# Patient Record
Sex: Female | Born: 1990 | Race: White | Hispanic: No | Marital: Married | Smoking: Never smoker
Health system: Southern US, Community
[De-identification: ages and names within clinical notes are randomized; demographics above are authoritative.]

---
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
## 2008-02-19 ENCOUNTER — Inpatient Hospital Stay (HOSPITAL_COMMUNITY): Admission: AD | Admit: 2008-02-19 | Discharge: 2008-02-19 | Payer: Self-pay | Admitting: Obstetrics & Gynecology

## 2008-03-12 ENCOUNTER — Inpatient Hospital Stay (HOSPITAL_COMMUNITY): Admission: AD | Admit: 2008-03-12 | Discharge: 2008-03-12 | Payer: Self-pay | Admitting: Obstetrics and Gynecology

## 2008-03-20 ENCOUNTER — Inpatient Hospital Stay (HOSPITAL_COMMUNITY): Admission: AD | Admit: 2008-03-20 | Discharge: 2008-03-22 | Payer: Self-pay | Admitting: Obstetrics and Gynecology

## 2009-03-05 ENCOUNTER — Emergency Department (HOSPITAL_COMMUNITY): Admission: EM | Admit: 2009-03-05 | Discharge: 2009-03-05 | Payer: Self-pay | Admitting: Emergency Medicine

## 2009-10-24 ENCOUNTER — Emergency Department (HOSPITAL_COMMUNITY): Admission: EM | Admit: 2009-10-24 | Discharge: 2009-10-24 | Payer: Self-pay | Admitting: Emergency Medicine

## 2010-06-10 LAB — CBC
HCT: 28.6 % — ABNORMAL LOW (ref 36.0–46.0)
HCT: 33.9 % — ABNORMAL LOW (ref 36.0–46.0)
Hemoglobin: 11.4 g/dL — ABNORMAL LOW (ref 12.0–15.0)
Hemoglobin: 9.8 g/dL — ABNORMAL LOW (ref 12.0–15.0)
MCHC: 34.4 g/dL (ref 30.0–36.0)
MCV: 87.7 fL (ref 78.0–100.0)
RDW: 13.2 % (ref 11.5–15.5)
WBC: 15.5 10*3/uL — ABNORMAL HIGH (ref 4.0–10.5)

## 2010-06-10 LAB — RPR: RPR Ser Ql: NONREACTIVE

## 2010-07-09 NOTE — Discharge Summary (Signed)
NAMELUDMILA, EBARB                ACCOUNT NO.:  [id number]   MEDICAL RECORD NO.:  [id number]          PATIENT TYPE:  INP   LOCATION:  9106                          FACILITY:  [facility]   PHYSICIAN:  Gerrit Friends. Aldona Bar, M.D.   DATE OF BIRTH:  February 06, 1991   DATE OF ADMISSION:  03/20/2008  DATE OF DISCHARGE:  03/22/2008                               DISCHARGE SUMMARY   DISCHARGE DIAGNOSES:  1. Term pregnancy, delivered 8 pounds 9 ounces female infant, Apgars 8      and 9.  2. Blood type A positive.   PROCEDURES:  1. Normal spontaneous delivery.  2. Second-degree tear and repair.   SUMMARY:  This 20 year old primigravida was admitted at term after  ruptured membranes and an uncomplicated prenatal course.  She  progressed, requiring Pitocin augmentation and received an epidural on  request and subsequently had a normal spontaneous delivery of a viable  female infant with good Apgars over a second-degree tear, which was  repaired without any difficulty.  Her postpartum course was  uncomplicated.  Her discharge hemoglobin was 9.8 with a white count  15,500 and a platelet count of 140,000.  The morning of March 22, 2008, she was afebrile, vital signs were stable.  She was ambulating  well, breast-feeding without difficulty, having normal bowel and bladder  function, was desirous of discharge.  Accordingly, she was given all  discharge instructions per discharge brochure and understood all  instructions well.   Discharge medications include vitamins one a day as long she is breast-  feeding, Feosol capsules - one daily until her postpartum visit in 4  weeks, Tylox 1-2 every 4-6 hours as needed for severe pain, and Motrin  600 mg every 6 hours for cramping or less severe pain.  As mentioned,  she will return to the office for followup in approximately 4 weeks'  time or as needed.   CONDITION ON DISCHARGE:  Improved.      Gerrit Friends. Aldona Bar, M.D.  Electronically Signed     RMW/MEDQ  D:   03/22/2008  T:  03/22/2008  Job:  [id number]

## 2010-08-08 ENCOUNTER — Ambulatory Visit (HOSPITAL_COMMUNITY)
Admission: RE | Admit: 2010-08-08 | Discharge: 2010-08-08 | Disposition: A | Discharge status: Home / Self Care | Payer: Medicaid Other | Source: Ambulatory Visit | Attending: Family Medicine | Admitting: Family Medicine

## 2010-08-08 ENCOUNTER — Other Ambulatory Visit (HOSPITAL_COMMUNITY): Payer: Self-pay | Admitting: Family Medicine

## 2010-08-08 ENCOUNTER — Inpatient Hospital Stay (HOSPITAL_COMMUNITY)
Admission: AD | Admit: 2010-08-08 | Discharge: 2010-08-08 | Disposition: A | Discharge status: Home / Self Care | Payer: Medicaid Other | Source: Ambulatory Visit | Attending: Obstetrics & Gynecology | Admitting: Obstetrics & Gynecology

## 2010-08-08 DIAGNOSIS — O209 Hemorrhage in early pregnancy, unspecified: Secondary | ICD-10-CM | POA: Insufficient documentation

## 2010-08-08 DIAGNOSIS — R1032 Left lower quadrant pain: Secondary | ICD-10-CM | POA: Insufficient documentation

## 2010-08-08 DIAGNOSIS — O00109 Unspecified tubal pregnancy without intrauterine pregnancy: Secondary | ICD-10-CM | POA: Insufficient documentation

## 2010-08-08 LAB — WET PREP, GENITAL
Trich, Wet Prep: NONE SEEN
WBC, Wet Prep HPF POC: NONE SEEN
Yeast Wet Prep HPF POC: NONE SEEN

## 2010-08-08 LAB — COMPREHENSIVE METABOLIC PANEL
Albumin: 4.3 g/dL (ref 3.5–5.2)
Chloride: 101 mEq/L (ref 96–112)
Creatinine, Ser: 0.67 mg/dL (ref 0.4–1.2)
GFR calc Af Amer: >60 mL/min (ref >60)
Glucose, Bld: 86 mg/dL (ref 70–99)
Potassium: 3.5 mEq/L (ref 3.5–5.1)
Sodium: 135 mEq/L (ref 135–145)

## 2010-08-08 LAB — DIFFERENTIAL
Basophils Relative: 0 % (ref 0–1)
Eosinophils Relative: 1 % (ref 0–5)
Lymphs Abs: 1.9 10*3/uL (ref 0.7–4.0)
Monocytes Relative: 7 % (ref 3–12)
Neutrophils Relative %: 64 % (ref 43–77)

## 2010-08-08 LAB — HCG, QUANTITATIVE, PREGNANCY: hCG, Beta Chain, Quant, S: 2602 m[IU]/mL — ABNORMAL HIGH (ref <5)

## 2010-08-08 LAB — CBC
MCV: 83.1 fL (ref 78.0–100.0)
RBC: 4.92 MIL/uL (ref 3.87–5.11)

## 2010-08-09 ENCOUNTER — Other Ambulatory Visit: Payer: Self-pay | Admitting: Obstetrics & Gynecology

## 2010-08-09 ENCOUNTER — Ambulatory Visit (HOSPITAL_COMMUNITY): Payer: Self-pay

## 2010-08-09 ENCOUNTER — Inpatient Hospital Stay (HOSPITAL_COMMUNITY): Payer: Medicaid Other

## 2010-08-09 ENCOUNTER — Ambulatory Visit (HOSPITAL_COMMUNITY)
Admission: AD | Admit: 2010-08-09 | Discharge: 2010-08-09 | Disposition: A | Discharge status: Home / Self Care | Payer: Medicaid Other | Source: Ambulatory Visit | Attending: Obstetrics & Gynecology | Admitting: Obstetrics & Gynecology

## 2010-08-09 DIAGNOSIS — O00109 Unspecified tubal pregnancy without intrauterine pregnancy: Secondary | ICD-10-CM

## 2010-08-09 HISTORY — PX: LAPAROSCOPY FOR ECTOPIC PREGNANCY: SUR765

## 2010-08-09 LAB — CBC
Hemoglobin: 13.5 g/dL (ref 12.0–15.0)
MCH: 28 pg (ref 26.0–34.0)
MCHC: 33.5 g/dL (ref 30.0–36.0)
MCV: 83.6 fL (ref 78.0–100.0)
Platelets: 207 10*3/uL (ref 150–400)
RBC: 4.82 MIL/uL (ref 3.87–5.11)
WBC: 6.4 10*3/uL (ref 4.0–10.5)

## 2010-08-09 LAB — GC/CHLAMYDIA PROBE AMP, GENITAL: Chlamydia, DNA Probe: NEGATIVE

## 2010-08-13 LAB — CROSSMATCH
ABO/RH(D): A POS
Antibody Screen: NEGATIVE
Unit division: 0

## 2010-08-25 NOTE — Op Note (Signed)
[name], [name]               ACCOUNT NO.:  [id number]  MEDICAL RECORD NO.:  [id number]  LOCATION:  [facility]                          FACILITY:  [facility]  PHYSICIAN:  Horton Chin, MD DATE OF BIRTH:  [birth year]  DATE OF PROCEDURE:  08/09/2010 DATE OF DISCHARGE:                              OPERATIVE REPORT   PREOPERATIVE DIAGNOSIS:  Ruptured ectopic pregnancy.  POSTOPERATIVE DIAGNOSIS:  Ruptured ectopic pregnancy.  PROCEDURE:  Laparoscopic left salpingectomy and removal of ectopic pregnancy.  SURGEON:  Horton Chin, MD  ANESTHESIA:  General.  ESTIMATED BLOOD LOSS:  100 mL.  INDICATIONS:  The patient is a 20 year old gravida 3, para 1-0-2-1, who was diagnosed with a left ectopic pregnancy and underwent administration of methotrexate on August 08, 2010.  The patient returned to the MAU today via EMS with debilitating pain, and bedside ultrasound showed a moderate amount of hemoperitoneum.  The patient also had an acute abdomen.  Given the concern about a ruptured ectopic pregnancy, the patient was counseled regarding needing urgent surgery.  The risks of surgery were explained to the patient including, but not limited to bleeding, infection, injury to surrounding organs, need for additional procedures, and written informed consent was obtained.  FINDINGS:  The patient has significant hemoperitoneum, left fallopian tubal ectopic pregnancy encompassing almost entire length of the tube. Normal uterus and both ovaries, normal right fallopian tubes.  SPECIMENS:  Left fallopian tube containing ectopic pregnancy which was sent to pathology.  COMPLICATIONS:  None immediate.  PROCEDURE IN DETAIL:  The patient received preoperative antibiotics and was taken to the operating room.  In the operating room, general analgesia was administered and found to be adequate.  She was then placed in the dorsal lithotomy position and prepped and draped in a sterile manner.  A Foley  catheter was inserted into the patient's bladder and attached to constant gravity.  A uterine manipulator was also inserted at this point.  After an adequate time-out was performed, attention was turned to her abdomen where an umbilical incision was made and a Veress needle was passed through the umbilical incision into the peritoneal cavity, correct intraperitoneal cavity placement was confirmed.  Upon insufflation with carbon dioxide gas with a low opening pressure of 2 mmHg, the abdomen was insufflated to 50 mmHg.  The Veress needle was removed and a 10-mm trocar was then placed through the umbilical incision.  The laparoscope was then placed through this, and a survey of the abdomen and pelvis were done which were notable for the findings as above.  At this point, bilateral lower quadrant ports were placed using 5-mm trocars.  Through these trocars, the left fallopian tube was grasped and the gyrus instrument was used to free the fallopian tube from its attachment to the uterus and mesosalpinx area.  The fallopian tube was able to removed in its entirety.  There was excellent hemostasis.  All the hemoperitoneum was suctioned using the Nezhat apparatus prior to doing the salpingectomy.  The abdomen was then irrigated again and hemostasis was confirmed.  At this point, an EndoCatch bag was placed and the tube was able to be removed in its entirety from the abdomen.  The  fascial incision of the umbilical port was repaired using 0 Vicryl interrupted sutures and the skin incision followed the umbilical port and the two 5-mm ports were closed using Dermabond.  The patient tolerated the procedure well.  Sponge, instrument, and needle counts were correct x2.  She was taken to recovery room awake, extubated and in stable condition.  DISPOSITION:  The patient was given routine laparoscopic discharge instructions and was also given prescription for Percocet, ibuprofen and Colace.  She was told  about her postoperative appointment that was made for her in the clinic on September 04, 2010, at 12:45 p.m.  She was told to call the clinic or come to the MAU for any further postoperative concerns.     Horton Chin, MD     UAA/MEDQ  D:  08/09/2010  T:  08/10/2010  Job:  [id number]  Electronically Signed by Jaynie Collins MD on 08/25/2010 10:00:24 AM

## 2010-08-25 DEATH — deceased

## 2010-09-04 ENCOUNTER — Encounter: Payer: Self-pay | Admitting: Family Medicine

## 2010-09-04 ENCOUNTER — Ambulatory Visit (INDEPENDENT_AMBULATORY_CARE_PROVIDER_SITE_OTHER): Payer: Medicaid Other | Admitting: Family Medicine

## 2010-09-04 VITALS — BP 134/79 | HR 64 | Temp 98.0°F | Ht 65.0 in | Wt 159.7 lb

## 2010-09-04 DIAGNOSIS — O00109 Unspecified tubal pregnancy without intrauterine pregnancy: Secondary | ICD-10-CM

## 2010-09-04 NOTE — Progress Notes (Signed)
  Subjective:     [name] is a 20 y.o. female who presents to the clinic 4 weeks status post left salpingectomy from ectopic pregnancy. Eating a regular diet without difficulty. Bowel movements are normal. The patient is not having any pain. Pt does however endorse some left sided cramping and pain after intercourse.  Review of Systems Pertinent items are noted in HPI.    Objective:    BP 134/79  Pulse 64  Temp(Src) 98 F (36.7 C) (Oral)  Ht 5\' 5"  (1.651 m)  Wt 159 lb 11.2 oz (72.439 kg)  BMI 26.58 kg/m2  LMP 06/27/2010 General:  alert, cooperative, appears stated age and no distress  Abdomen: soft, bowel sounds active, non-tender  Incision:   healing well, no drainage, no erythema, no seroma, no swelling     Assessment:    Doing well postoperatively. Operative findings again reviewed. Pathology report discussed.    Plan:    1. Continue any current medications. 2. Wound care discussed. 3. Activity restrictions: none 4. Anticipated return to work: not applicable. 5. Follow up: PRN

## 2010-11-29 LAB — URINALYSIS, ROUTINE W REFLEX MICROSCOPIC
Glucose, UA: NEGATIVE mg/dL
Ketones, ur: NEGATIVE mg/dL
Nitrite: NEGATIVE
pH: 7.5 (ref 5.0–8.0)

## 2013-05-07 IMAGING — US US OB TRANSVAGINAL
1 series · 14 of 28 positions shown · non-contrast
Comparison: none

[Series 1: us ob transvaginal · 14 of 30 slices shown]
[im 2/30]
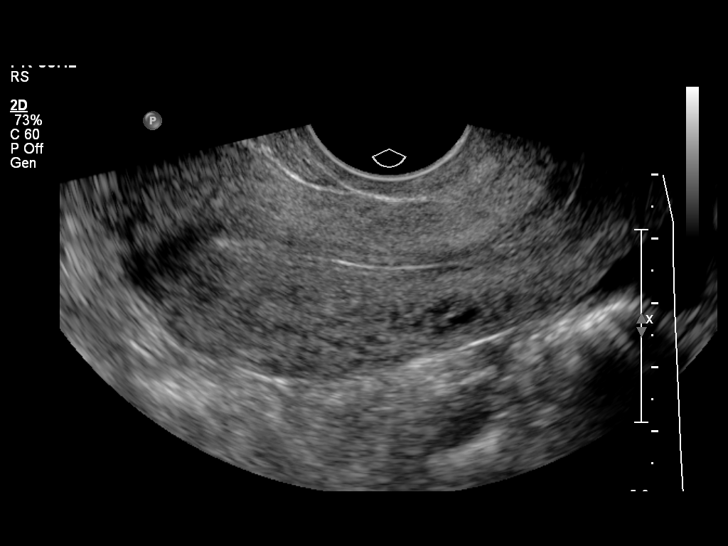
[im 4/30]
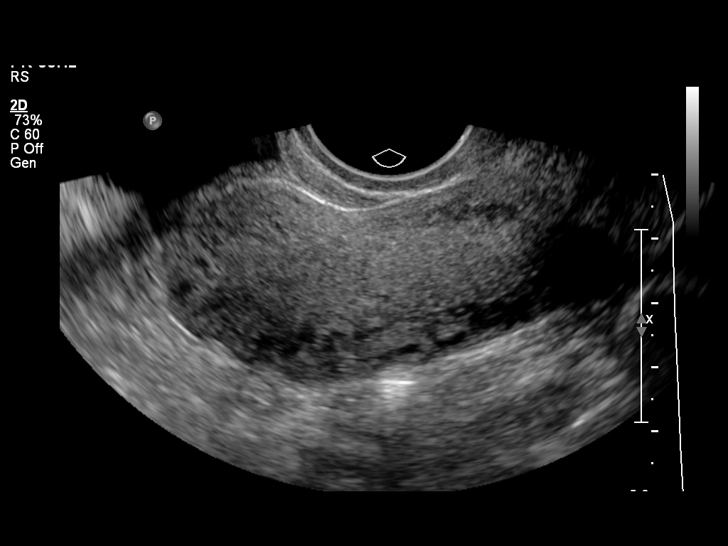
[im 6/30]
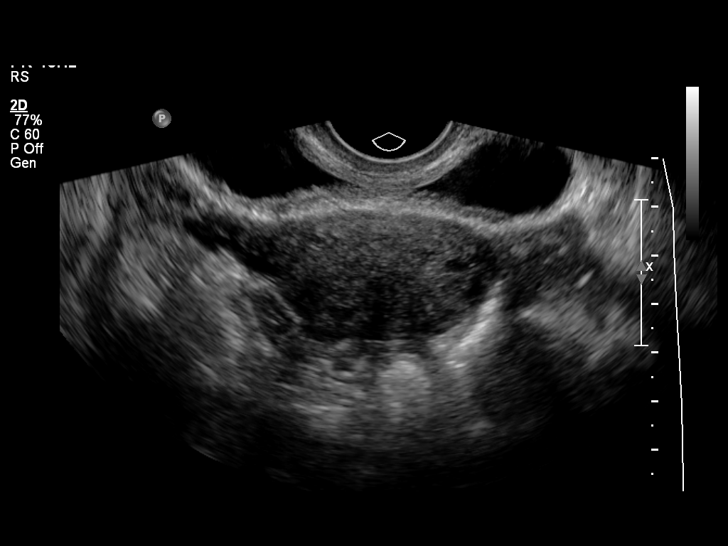
[im 8/30]
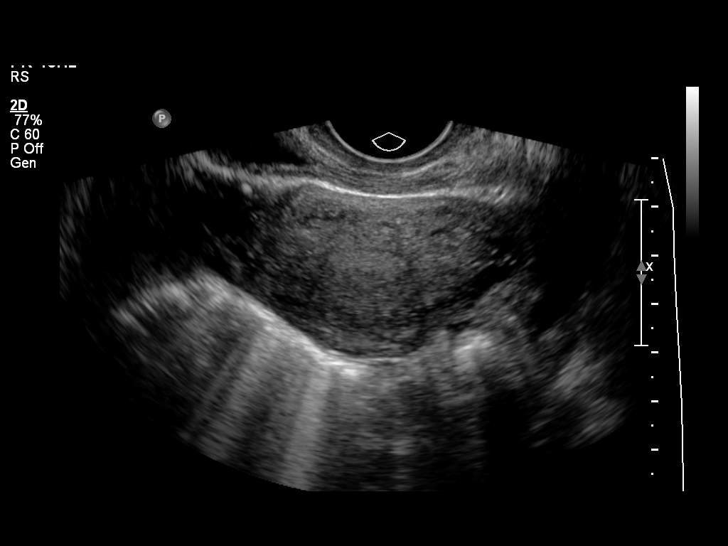
[im 10/30]
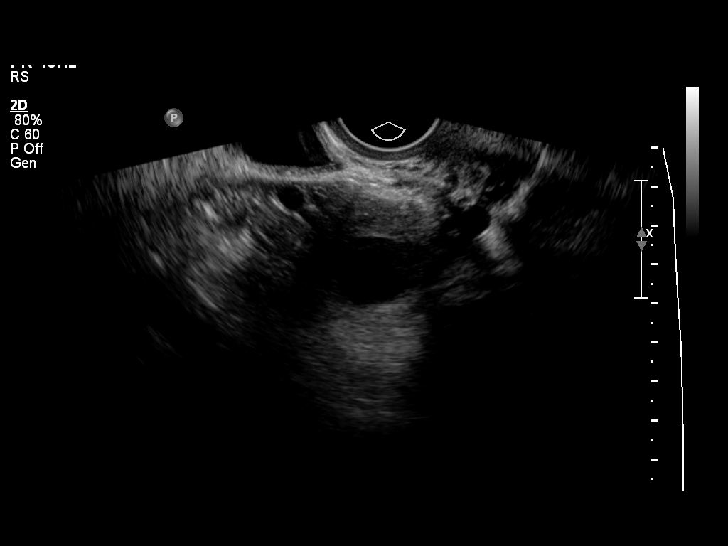
[im 12/30]
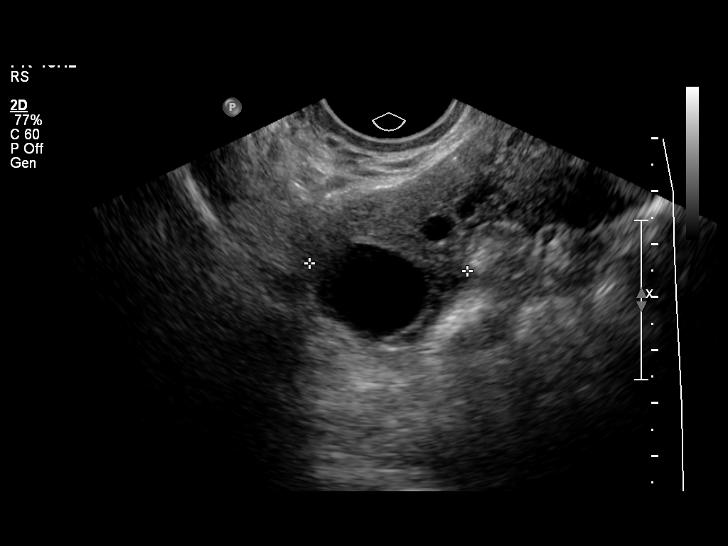
[im 14/30]
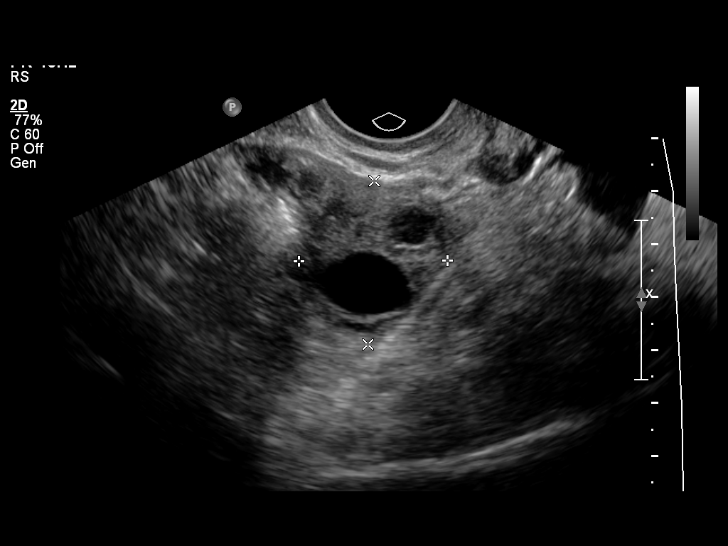
[im 17/30]
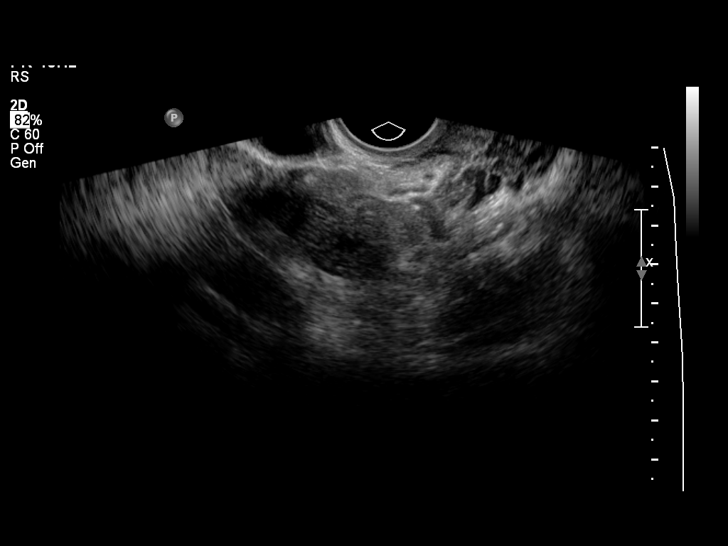
[im 19/30]
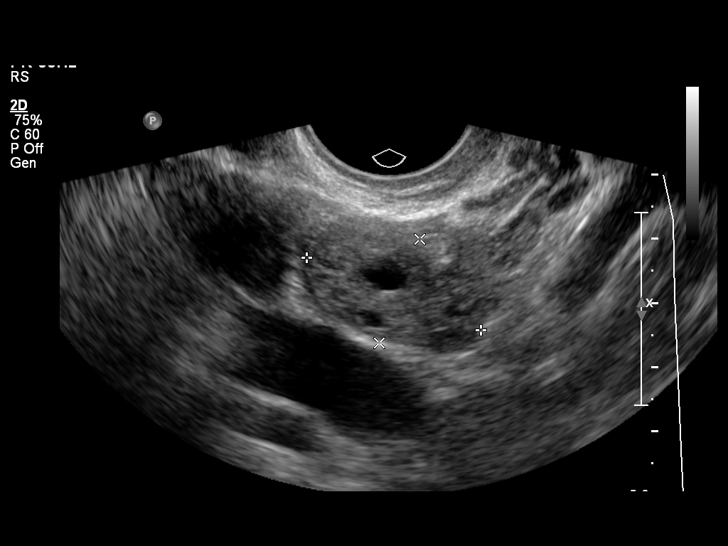
[im 21/30]
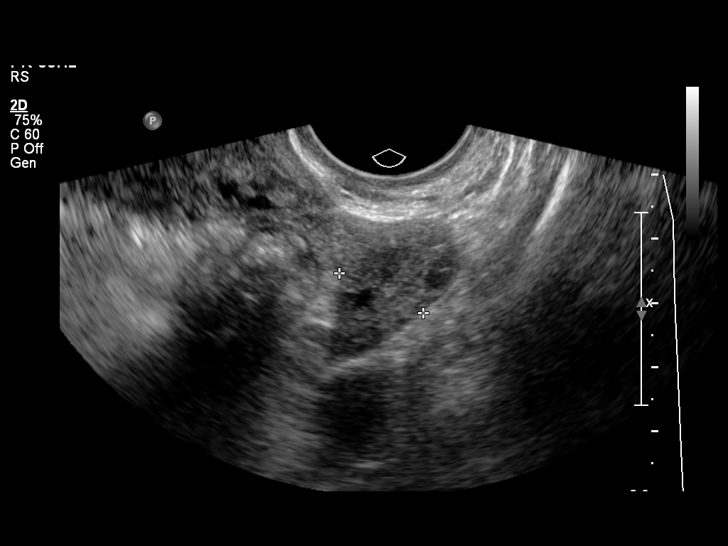
[im 23/30]
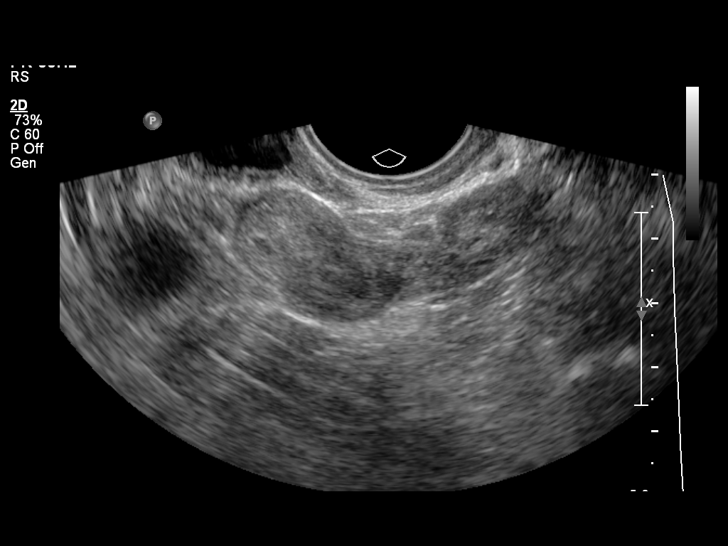
[im 25/30]
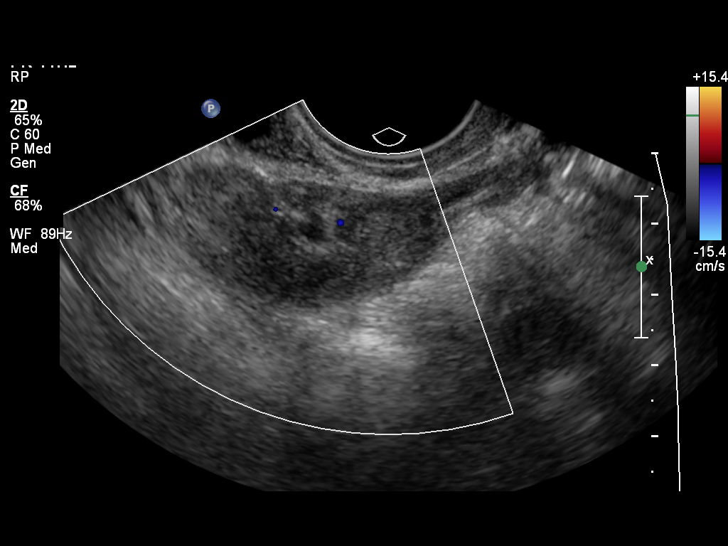
[im 27/30]
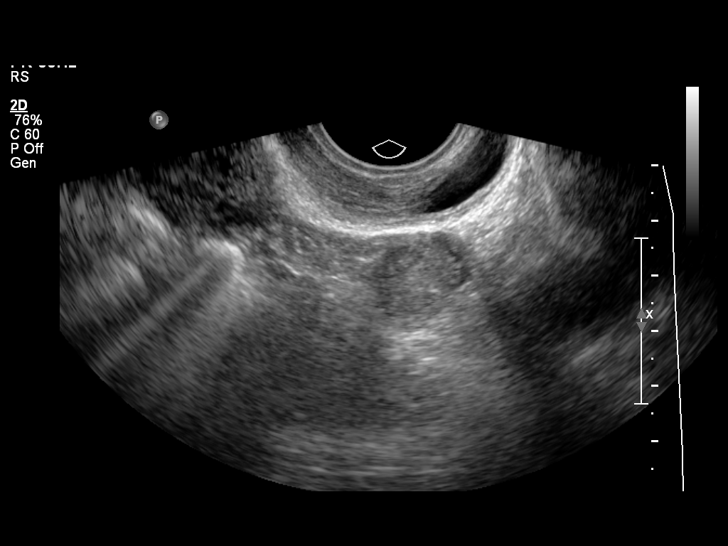
[im 30/30]
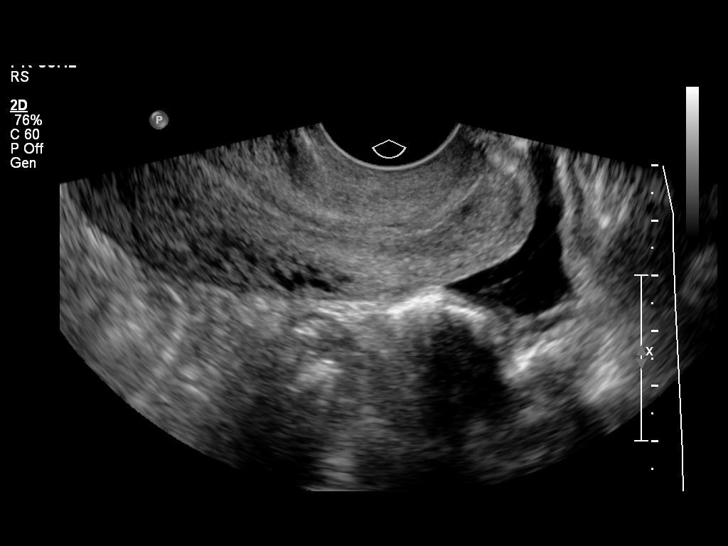

[14 of 28 positions shown; findings below may reference images not displayed]

OBSTETRICS REPORT
                      (Signed Final 08/09/2010 [DATE])

Procedures

 US OB TRANSVAGINAL                                    76817.0
Indications

 Pain - LLQ
 Ectopic pregnancy
Fetal Evaluation

 Preg. Location:    Ectopic in left adnexa
 Gest. Sac:         None seen
 Yolk Sac:          Not visualized
 Fetal Pole:        Not visualized
 Cardiac Activity:  No embryo visualized
Cervix Uterus Adnexa

 Cervix:       Normal appearance by transvaginal scan
 Uterus:       No abnormality visualized.
 Cul De Sac:   Small amount of simple free fluid seen.
 Left Ovary:    Within normal limits measuring 2.9 x 1.7 x 1.5 cm.
 Right Ovary:   Within normal limits measuring 3.0 x 2.8 x 3.1 cm.
 Adnexa:     Soft tissue mass superior to left ovary measuring 2.6 x
             1.6 x 1.6 cm.
Impression

 Thin endometrial stripe with no IUP seen. Normal ovaries.
 Soft tissue mass superior to the right ovary is slightly larger
 than on prior exam and again strongly suspicious for an
 ectopic gestation. No increase in pelvic fluid or developement
 of complex fluid is seen.

## 2021-02-13 ENCOUNTER — Other Ambulatory Visit: Payer: Self-pay

## 2021-02-13 ENCOUNTER — Ambulatory Visit (INDEPENDENT_AMBULATORY_CARE_PROVIDER_SITE_OTHER): Payer: 59 | Admitting: Family Medicine

## 2021-02-13 ENCOUNTER — Encounter: Payer: Self-pay | Admitting: Family Medicine

## 2021-02-13 VITALS — BP 107/63 | HR 56 | Temp 98.4°F | Ht 65.5 in | Wt 181.6 lb

## 2021-02-13 DIAGNOSIS — E663 Overweight: Secondary | ICD-10-CM | POA: Insufficient documentation

## 2021-02-13 DIAGNOSIS — N926 Irregular menstruation, unspecified: Secondary | ICD-10-CM

## 2021-02-13 DIAGNOSIS — Z1159 Encounter for screening for other viral diseases: Secondary | ICD-10-CM

## 2021-02-13 DIAGNOSIS — R001 Bradycardia, unspecified: Secondary | ICD-10-CM | POA: Diagnosis not present

## 2021-02-13 DIAGNOSIS — R202 Paresthesia of skin: Secondary | ICD-10-CM | POA: Diagnosis not present

## 2021-02-13 DIAGNOSIS — Z13220 Encounter for screening for lipoid disorders: Secondary | ICD-10-CM

## 2021-02-13 DIAGNOSIS — Z114 Encounter for screening for human immunodeficiency virus [HIV]: Secondary | ICD-10-CM

## 2021-02-13 LAB — URINALYSIS, ROUTINE W REFLEX MICROSCOPIC
Bilirubin, UA: NEGATIVE
Glucose, UA: NEGATIVE
Ketones, UA: NEGATIVE
Leukocytes,UA: NEGATIVE
Nitrite, UA: NEGATIVE
Protein,UA: NEGATIVE
RBC, UA: NEGATIVE
Specific Gravity, UA: 1.02 (ref 1.005–1.030)
Urobilinogen, Ur: 0.2 mg/dL (ref 0.2–1.0)
pH, UA: 7.5 (ref 5.0–7.5)

## 2021-02-13 LAB — BAYER DCA HB A1C WAIVED: HB A1C (BAYER DCA - WAIVED): 4.9 % (ref 4.8–5.6)

## 2021-02-13 NOTE — Assessment & Plan Note (Signed)
Long discussion with patient today about goal of her being healthy rather than focusing on a number. We will check her labs today, however they have been checked previously and have been normal. Discussed that this may be related to genetics or even a result of whatever unknown issue caused her to lose 60lbs as a child. Await labs and continue to monitor. We did discuss that if her concern with her weight becomes overwhelming we can consider medication to help with with worry like a wellbutrin. She is not interested in any medication at this time.

## 2021-02-13 NOTE — Assessment & Plan Note (Signed)
Long discussion with patient today. HR likely due to healthy cardiovascular state due to her regular exercise. She is concerned that there is something going on resulting in her being unable to lose weight. She also notes the cold, white, numb arms and hands on days her HR is lower and she doesn't do cardio. Adson's test was negative, unlikely thoracic outlet- but may need [place] in the future to confirm. Will get her in to see Dr. Mariah Milling to evaluate need for further work up. Check labs today. Await results. Continue to monitor closely.

## 2021-02-13 NOTE — Progress Notes (Signed)
BP 107/63    Pulse (!) 56    Temp 98.4 F (36.9 C)    Ht 5' 5.5" (1.664 m)    Wt 181 lb 9.6 oz (82.4 kg)    SpO2 100%    BMI 29.76 kg/m    Subjective:    Patient ID: [name], female    DOB: 1990-06-26, 30 y.o.   MRN: [id number]  HPI: [name] is a 30 y.o. female who presents today to establish care.   Chief Complaint  Patient presents with   Establish Care   Numbness    Patient states she has noticed her arms fall asleep sometimes at night. Patient works out and notices that when she doesn't work out her arms will fall asleep at night.    Weight Loss    Patient states she has been actively trying to loose weight but cant seem to lose the amount she wants    Has had some issues with having health care providers believe her in the past. She is hesitant about her care and is afraid people will tell her there is nothing wrong.  [name] notes that hs has been having trouble with her weight. She notes that when she was a child she lost 60lbs in about a year without effort. They did not know why and a cause was never found. She has slowly been gaining some weight back over the past 20 years. She notes that she gained 75lbs with her first pregnancy. She was able to lose that weight in about a year. She gained about 65lbs with her 2nd pregnancy. She was able to get down to 160. She gained 35lbs with her 3rd pregnancy (about 7 years ago) and has not been able to lose the weight. She generally works out about 4-5 days a week for a couple of hours doing both weights and cardio. She watches what she eats and tries to keep it balance. She has been doing intermittent fasting for about 2 weeks. She notes that she has a pretty low resting heart rate. She sits usually in the 50s, but goes down into the 40s at night. When she works out, her heart rate will go up into the 180s and then will not go as low for the rest of the day.   She is concerned about not being able to lose weight because her HR  doesn't get high enough. She is also concerned because her hands and arms tend to get cold, white and numb at night. This only seems to happen on days when she doesn't do cardio and notes it seems to corollate to when her HR is particularly low. She notes that she will shake and rub her arms and they seem to take several minutes to warm up and gain feeling again.   She also notes that her periods have been odd for the past couple of years. They are pretty regular, but she will start with a heavy flow, then stop with no bleeding for 2 days, then start bleeding again for a week. She will also have intermenstrual bleeding with sex if she has sex within the 2 weeks around her periods and this will cause several days of bleeding.   She notes that she has been under a lot of stress- she has 3 daughters and is working on adopting as well. She doesn't feel like she is depressed, but she does get overwhelmed with some of the physical things that she has going  on. She is worried that there is something going on. No other concerns or complaints at this time.   Active Ambulatory Problems    Diagnosis Date Noted   Asthma 11/05/2007   Bradycardia 02/13/2021   Overweight 02/13/2021   Resolved Ambulatory Problems    Diagnosis Date Noted   Ectopic pregnancy, tubal 08/08/2010   Past Medical History:  Diagnosis Date   Asthma    Past Surgical History:  Procedure Laterality Date   LAPAROSCOPY FOR ECTOPIC PREGNANCY  08/09/2010   No outpatient encounter medications on file as of 02/13/2021.   No facility-administered encounter medications on file as of 02/13/2021.   No Known Allergies  Family History  Problem Relation Age of Onset   Huntington's disease Mother    Depression Mother    Hypertension Father    Huntington's disease Maternal Grandfather    Mental illness Paternal Grandmother    Social History   Socioeconomic History   Marital status: Married    Spouse name: Not on file   Number of  children: Not on file   Years of education: Not on file   Highest education level: Not on file  Occupational History   Not on file  Tobacco Use   Smoking status: Never   Smokeless tobacco: Never  Vaping Use   Vaping Use: Never used  Substance and Sexual Activity   Alcohol use: Yes    Alcohol/week: 0.0 standard drinks   Drug use: No   Sexual activity: Yes    Birth control/protection: None  Other Topics Concern   Not on file  Social History Narrative   Not on file   Social Determinants of Health   Financial Resource Strain: Not on file  Food Insecurity: Not on file  Transportation Needs: Not on file  Physical Activity: Not on file  Stress: Not on file  Social Connections: Not on file    Review of Systems  Constitutional:  Positive for fatigue. Negative for activity change, appetite change, chills, diaphoresis, fever and unexpected weight change.  HENT: Negative.    Respiratory: Negative.    Cardiovascular: Negative.   Gastrointestinal: Negative.   Endocrine: Negative.  Negative for cold intolerance, heat intolerance, polydipsia, polyphagia and polyuria.  Musculoskeletal: Negative.   Skin:  Positive for pallor. Negative for color change, rash and wound.  Neurological:  Positive for numbness. Negative for dizziness, tremors, seizures, syncope, facial asymmetry, speech difficulty, weakness, light-headedness and headaches.  Psychiatric/Behavioral:  Positive for dysphoric mood. Negative for agitation, behavioral problems, confusion, decreased concentration, hallucinations, self-injury, sleep disturbance and suicidal ideas. The patient is not nervous/anxious and is not hyperactive.    Per HPI unless specifically indicated above     Objective:    BP 107/63    Pulse (!) 56    Temp 98.4 F (36.9 C)    Ht 5' 5.5" (1.664 m)    Wt 181 lb 9.6 oz (82.4 kg)    SpO2 100%    BMI 29.76 kg/m   Wt Readings from Last 3 Encounters:  02/13/21 181 lb 9.6 oz (82.4 kg)  09/04/10 159 lb 11.2  oz (72.4 kg)    Physical Exam Vitals and nursing note reviewed.  Constitutional:      General: She is not in acute distress.    Appearance: Normal appearance. She is not ill-appearing, toxic-appearing or diaphoretic.  HENT:     Head: Normocephalic and atraumatic.     Right Ear: External ear normal.     Left Ear: External ear normal.  Nose: Nose normal.     Mouth/Throat:     Mouth: Mucous membranes are moist.     Pharynx: Oropharynx is clear.  Eyes:     General: No scleral icterus.       Right eye: No discharge.        Left eye: No discharge.     Extraocular Movements: Extraocular movements intact.     Conjunctiva/sclera: Conjunctivae normal.     Pupils: Pupils are equal, round, and reactive to light.  Neck:     Vascular: No carotid bruit.  Cardiovascular:     Rate and Rhythm: Regular rhythm. Bradycardia present.     Pulses: Normal pulses.     Heart sounds: Normal heart sounds. No murmur heard.   No friction rub. No gallop.     Comments: Negative Adson's bilaterally Pulmonary:     Effort: Pulmonary effort is normal. No respiratory distress.     Breath sounds: Normal breath sounds. No stridor. No wheezing, rhonchi or rales.  Chest:     Chest wall: No tenderness.  Musculoskeletal:        General: No swelling, tenderness, deformity or signs of injury. Normal range of motion.     Cervical back: Normal range of motion and neck supple. No rigidity or tenderness.     Right lower leg: No edema.     Left lower leg: No edema.  Lymphadenopathy:     Cervical: No cervical adenopathy.  Skin:    General: Skin is warm and dry.     Capillary Refill: Capillary refill takes less than 2 seconds.     Coloration: Skin is not jaundiced or pale.     Findings: No bruising, erythema, lesion or rash.  Neurological:     General: No focal deficit present.     Mental Status: She is alert and oriented to person, place, and time. Mental status is at baseline.     Cranial Nerves: No cranial nerve  deficit.     Sensory: No sensory deficit.     Motor: No weakness.     Coordination: Coordination normal.     Gait: Gait normal.     Deep Tendon Reflexes: Reflexes normal.  Psychiatric:        Mood and Affect: Mood normal.        Behavior: Behavior normal.        Thought Content: Thought content normal.        Judgment: Judgment normal.    Results for orders placed or performed in visit on 02/13/21  Urinalysis, Routine w reflex microscopic  Result Value Ref Range   Specific Gravity, UA 1.020 1.005 - 1.030   pH, UA 7.5 5.0 - 7.5   Color, UA Yellow Yellow   Appearance Ur Clear Clear   Leukocytes,UA Negative Negative   Protein,UA Negative Negative/Trace   Glucose, UA Negative Negative   Ketones, UA Negative Negative   RBC, UA Negative Negative   Bilirubin, UA Negative Negative   Urobilinogen, Ur 0.2 0.2 - 1.0 mg/dL   Nitrite, UA Negative Negative  Bayer DCA Hb A1c Waived  Result Value Ref Range   HB A1C (BAYER DCA - WAIVED) 4.9 4.8 - 5.6 %      Assessment & Plan:   Problem List Items Addressed This Visit       Other   Bradycardia - Primary    Haas discussion with patient today. HR likely due to healthy cardiovascular state due to her regular exercise. She is concerned that there  is something going on resulting in her being unable to lose weight. She also notes the cold, white, numb arms and hands on days her HR is lower and she doesn't do cardio. Adson's test was negative, unlikely thoracic outlet- but may need [place] in the future to confirm. Will get her in to see Dr. Mariah Milling to evaluate need for further work up. Check labs today. Await results. Continue to monitor closely.      Relevant Orders   Ambulatory referral to Cardiology   Overweight    Haas discussion with patient today about goal of her being healthy rather than focusing on a number. We will check her labs today, however they have been checked previously and have been normal. Discussed that this may be related to  genetics or even a result of whatever unknown issue caused her to lose 60lbs as a child. Await labs and continue to monitor. We did discuss that if her concern with her weight becomes overwhelming we can consider medication to help with with worry like a wellbutrin. She is not interested in any medication at this time.       Other Visit Diagnoses     Abnormal menstrual periods       May need [place]. Will check labs. Referral to GYN made. ?Due for pap- obtaining records.    Relevant Orders   Estradiol   Testosterone, free, total([facility])   LH   FSH   Prolactin   Ambulatory referral to Obstetrics / Gynecology   Paresthesias       Will check labs. See discussion under bradycardia. Await results.    Relevant Orders   CBC with Differential/Platelet   Comprehensive metabolic panel   Urinalysis, Routine w reflex microscopic (Completed)   Thyroid Panel With TSH   VITAMIN D 25 Hydroxy (Vit-D Deficiency, Fractures)   B12   Bayer DCA Hb A1c Waived (Completed)   Ferritin   Screening for cholesterol level       Labs drawn today. Await results.    Relevant Orders   Lipid Panel w/o Chol/HDL Ratio   Need for hepatitis C screening test       Labs drawn today. Await results.    Relevant Orders   Hepatitis C Antibody   Screening for HIV (human immunodeficiency virus)       Labs drawn today. Await results.    Relevant Orders   HIV Antibody (routine testing w rflx)        Follow up plan: Return As able for physical, for records release last doctor in [place].  >1 hour spent with patient today.

## 2021-02-15 LAB — COMPREHENSIVE METABOLIC PANEL
ALT: 9 IU/L (ref 0–32)
AST: 19 IU/L (ref 0–40)
Albumin/Globulin Ratio: 1.9 (ref 1.2–2.2)
Albumin: 4.8 g/dL (ref 3.8–4.8)
Alkaline Phosphatase: 44 IU/L (ref 44–121)
BUN/Creatinine Ratio: 15 (ref 9–23)
BUN: 13 mg/dL (ref 6–20)
Bilirubin Total: 0.6 mg/dL (ref 0.0–1.2)
CO2: 25 mmol/L (ref 20–29)
Calcium: 9.2 mg/dL (ref 8.7–10.2)
Chloride: 101 mmol/L (ref 96–106)
Creatinine, Ser: 0.85 mg/dL (ref 0.57–1.00)
Globulin, Total: 2.5 g/dL (ref 1.5–4.5)
Glucose: 92 mg/dL (ref 70–99)
Potassium: 4.2 mmol/L (ref 3.5–5.2)
Sodium: 140 mmol/L (ref 134–144)
Total Protein: 7.3 g/dL (ref 6.0–8.5)
eGFR: 93 mL/min/{1.73_m2} (ref >59)

## 2021-02-15 LAB — THYROID PANEL WITH TSH
Free Thyroxine Index: 1.7 (ref 1.2–4.9)
T3 Uptake Ratio: 25 % (ref 24–39)
T4, Total: 6.9 ug/dL (ref 4.5–12.0)
TSH: 1.36 u[IU]/mL (ref 0.450–4.500)

## 2021-02-15 LAB — CBC WITH DIFFERENTIAL/PLATELET
Basophils Absolute: 0 10*3/uL (ref 0.0–0.2)
Basos: 1 %
EOS (ABSOLUTE): 0.1 10*3/uL (ref 0.0–0.4)
Eos: 2 %
Hematocrit: 44.5 % (ref 34.0–46.6)
Hemoglobin: 15 g/dL (ref 11.1–15.9)
Immature Grans (Abs): 0 10*3/uL (ref 0.0–0.1)
Immature Granulocytes: 0 %
Lymphocytes Absolute: 1.8 10*3/uL (ref 0.7–3.1)
Lymphs: 35 %
MCH: 29.5 pg (ref 26.6–33.0)
MCHC: 33.7 g/dL (ref 31.5–35.7)
MCV: 88 fL (ref 79–97)
Monocytes Absolute: 0.4 10*3/uL (ref 0.1–0.9)
Monocytes: 7 %
Neutrophils Absolute: 2.9 10*3/uL (ref 1.4–7.0)
Neutrophils: 55 %
Platelets: 214 10*3/uL (ref 150–450)
RBC: 5.08 x10E6/uL (ref 3.77–5.28)
RDW: 11.8 % (ref 11.7–15.4)
WBC: 5.2 10*3/uL (ref 3.4–10.8)

## 2021-02-15 LAB — VITAMIN D 25 HYDROXY (VIT D DEFICIENCY, FRACTURES): Vit D, 25-Hydroxy: 34.2 ng/mL (ref 30.0–100.0)

## 2021-02-15 LAB — LIPID PANEL W/O CHOL/HDL RATIO
Cholesterol, Total: 199 mg/dL (ref 100–199)
HDL: 80 mg/dL (ref >39)
LDL Chol Calc (NIH): 110 mg/dL — ABNORMAL HIGH (ref 0–99)
Triglycerides: 49 mg/dL (ref 0–149)
VLDL Cholesterol Cal: 9 mg/dL (ref 5–40)

## 2021-02-15 LAB — TESTOSTERONE, FREE, TOTAL, SHBG
Sex Hormone Binding: 51.4 nmol/L (ref 24.6–122.0)
Testosterone, Free: 5 pg/mL — ABNORMAL HIGH (ref 0.0–4.2)
Testosterone: 19 ng/dL (ref 8–60)

## 2021-02-15 LAB — FERRITIN: Ferritin: 48 ng/mL (ref 15–150)

## 2021-02-15 LAB — LUTEINIZING HORMONE: LH: 6.3 m[IU]/mL

## 2021-02-15 LAB — VITAMIN B12: Vitamin B-12: 481 pg/mL (ref 232–1245)

## 2021-02-15 LAB — HIV ANTIBODY (ROUTINE TESTING W REFLEX): HIV Screen 4th Generation wRfx: NONREACTIVE

## 2021-02-15 LAB — HEPATITIS C ANTIBODY: Hep C Virus Ab: <0.1 s/co ratio (ref 0.0–0.9)

## 2021-02-15 LAB — PROLACTIN: Prolactin: 8.9 ng/mL (ref 4.8–23.3)

## 2021-02-15 LAB — ESTRADIOL: Estradiol: 167 pg/mL

## 2021-02-15 LAB — FOLLICLE STIMULATING HORMONE: FSH: 3.6 m[IU]/mL

## 2021-03-04 ENCOUNTER — Encounter: Payer: Self-pay | Admitting: Family Medicine

## 2021-03-04 ENCOUNTER — Ambulatory Visit (INDEPENDENT_AMBULATORY_CARE_PROVIDER_SITE_OTHER): Payer: 59 | Admitting: Family Medicine

## 2021-03-04 ENCOUNTER — Other Ambulatory Visit: Payer: Self-pay

## 2021-03-04 VITALS — BP 93/62 | HR 52 | Temp 98.1°F | Ht 66.0 in | Wt 184.4 lb

## 2021-03-04 DIAGNOSIS — Z0000 Encounter for general adult medical examination without abnormal findings: Secondary | ICD-10-CM | POA: Diagnosis not present

## 2021-03-04 DIAGNOSIS — Z111 Encounter for screening for respiratory tuberculosis: Secondary | ICD-10-CM

## 2021-03-04 NOTE — Progress Notes (Signed)
BP 93/62    Pulse (!) 52    Temp 98.1 F (36.7 C)    Ht 5' 6"  (1.676 m)    Wt 184 lb 6.4 oz (83.6 kg)    SpO2 100%    BMI 29.76 kg/m    Subjective:    Patient ID: [name], female    DOB: Jul 15, 1990, 31 y.o.   MRN: [id number]  HPI: Michelle ENCINAS is a 31 y.o. female presenting on 03/04/2021 for comprehensive medical examination. Current medical complaints include:none  She currently lives with: husband and kids Menopausal Symptoms: no  Depression Screen done today and results listed below:  Depression screen [facility] 2/9 03/04/2021 02/13/2021  Decreased Interest 0 0  Down, Depressed, Hopeless 0 0  PHQ - 2 Score 0 0  Altered sleeping 0 2  Tired, decreased energy 1 2  Change in appetite 0 0  Feeling bad or failure about yourself  0 1  Trouble concentrating 0 2  Moving slowly or fidgety/restless 0 0  Suicidal thoughts 0 0  PHQ-9 Score 1 7  Difficult doing work/chores - Not difficult at all    Past Medical History:  Past Medical History:  Diagnosis Date   Asthma    in high school    Surgical History:  Past Surgical History:  Procedure Laterality Date   LAPAROSCOPY FOR ECTOPIC PREGNANCY  08/09/2010    Medications:  No current outpatient medications on file prior to visit.   No current facility-administered medications on file prior to visit.    Allergies:  No Known Allergies  Social History:  Social History   Socioeconomic History   Marital status: Married    Spouse name: Not on file   Number of children: Not on file   Years of education: Not on file   Highest education level: Not on file  Occupational History   Not on file  Tobacco Use   Smoking status: Never   Smokeless tobacco: Never  Vaping Use   Vaping Use: Never used  Substance and Sexual Activity   Alcohol use: Yes    Comment: 2 a week   Drug use: No   Sexual activity: Yes    Birth control/protection: None  Other Topics Concern   Not on file  Social History Narrative   Not on file    Social Determinants of Health   Financial Resource Strain: Not on file  Food Insecurity: Not on file  Transportation Needs: Not on file  Physical Activity: Not on file  Stress: Not on file  Social Connections: Not on file  Intimate Partner Violence: Not on file   Social History   Tobacco Use  Smoking Status Never  Smokeless Tobacco Never   Social History   Substance and Sexual Activity  Alcohol Use Yes   Comment: 2 a week    Family History:  Family History  Problem Relation Age of Onset   Huntington's disease Mother    Depression Mother    Hypertension Father    Huntington's disease Maternal Grandfather    Mental illness Paternal Grandmother     Past medical history, surgical history, medications, allergies, family history and social history reviewed with patient today and changes made to appropriate areas of the chart.   Review of Systems  Constitutional: Negative.   HENT: Negative.    Eyes:  Positive for blurred vision. Negative for double vision, photophobia, pain, discharge and redness.  Respiratory: Negative.    Cardiovascular: Negative.   Gastrointestinal: Negative.  Genitourinary: Negative.   Musculoskeletal: Negative.   Skin: Negative.   Neurological:  Positive for tingling. Negative for dizziness, tremors, sensory change, speech change, focal weakness, seizures, loss of consciousness, weakness and headaches.  Endo/Heme/Allergies: Negative.   Psychiatric/Behavioral: Negative.    All other ROS negative except what is listed above and in the HPI.      Objective:    BP 93/62    Pulse (!) 52    Temp 98.1 F (36.7 C)    Ht 5' 6"  (1.676 m)    Wt 184 lb 6.4 oz (83.6 kg)    SpO2 100%    BMI 29.76 kg/m   Wt Readings from Last 3 Encounters:  03/04/21 184 lb 6.4 oz (83.6 kg)  02/13/21 181 lb 9.6 oz (82.4 kg)  09/04/10 159 lb 11.2 oz (72.4 kg)    Physical Exam Vitals and nursing note reviewed.  Constitutional:      General: She is not in acute  distress.    Appearance: Normal appearance. She is not ill-appearing, toxic-appearing or diaphoretic.  HENT:     Head: Normocephalic and atraumatic.     Right Ear: Tympanic membrane, ear canal and external ear normal. There is no impacted cerumen.     Left Ear: Tympanic membrane, ear canal and external ear normal. There is no impacted cerumen.     Nose: Nose normal. No congestion or rhinorrhea.     Mouth/Throat:     Mouth: Mucous membranes are moist.     Pharynx: Oropharynx is clear. No oropharyngeal exudate or posterior oropharyngeal erythema.  Eyes:     General: No scleral icterus.       Right eye: No discharge.        Left eye: No discharge.     Extraocular Movements: Extraocular movements intact.     Conjunctiva/sclera: Conjunctivae normal.     Pupils: Pupils are equal, round, and reactive to light.  Neck:     Vascular: No carotid bruit.  Cardiovascular:     Rate and Rhythm: Normal rate and regular rhythm.     Pulses: Normal pulses.     Heart sounds: No murmur heard.   No friction rub. No gallop.  Pulmonary:     Effort: Pulmonary effort is normal. No respiratory distress.     Breath sounds: Normal breath sounds. No stridor. No wheezing, rhonchi or rales.  Chest:     Chest wall: No tenderness.  Abdominal:     General: Abdomen is flat. Bowel sounds are normal. There is no distension.     Palpations: Abdomen is soft. There is no mass.     Tenderness: There is no abdominal tenderness. There is no right CVA tenderness, left CVA tenderness, guarding or rebound.     Hernia: No hernia is present.  Genitourinary:    Comments: Breast and pelvic exams deferred with shared decision making Musculoskeletal:        General: No swelling, tenderness, deformity or signs of injury. Normal range of motion.     Cervical back: Normal range of motion and neck supple. No rigidity. No muscular tenderness.     Right lower leg: No edema.     Left lower leg: No edema.  Lymphadenopathy:      Cervical: No cervical adenopathy.  Skin:    General: Skin is warm and dry.     Capillary Refill: Capillary refill takes less than 2 seconds.     Coloration: Skin is not jaundiced or pale.     Findings:  No bruising, erythema, lesion or rash.  Neurological:     General: No focal deficit present.     Mental Status: She is alert and oriented to person, place, and time. Mental status is at baseline.     Cranial Nerves: No cranial nerve deficit.     Sensory: No sensory deficit.     Motor: No weakness.     Coordination: Coordination normal.     Gait: Gait normal.     Deep Tendon Reflexes: Reflexes normal.  Psychiatric:        Mood and Affect: Mood normal.        Behavior: Behavior normal.        Thought Content: Thought content normal.        Judgment: Judgment normal.    Results for orders placed or performed in visit on 02/13/21  CBC with Differential/Platelet  Result Value Ref Range   WBC 5.2 3.4 - 10.8 x10E3/uL   RBC 5.08 3.77 - 5.28 x10E6/uL   Hemoglobin 15.0 11.1 - 15.9 g/dL   Hematocrit 44.5 34.0 - 46.6 %   MCV 88 79 - 97 fL   MCH 29.5 26.6 - 33.0 pg   MCHC 33.7 31.5 - 35.7 g/dL   RDW 11.8 11.7 - 15.4 %   Platelets 214 150 - 450 x10E3/uL   Neutrophils 55 Not Estab. %   Lymphs 35 Not Estab. %   Monocytes 7 Not Estab. %   Eos 2 Not Estab. %   Basos 1 Not Estab. %   Neutrophils Absolute 2.9 1.4 - 7.0 x10E3/uL   Lymphocytes Absolute 1.8 0.7 - 3.1 x10E3/uL   Monocytes Absolute 0.4 0.1 - 0.9 x10E3/uL   EOS (ABSOLUTE) 0.1 0.0 - 0.4 x10E3/uL   Basophils Absolute 0.0 0.0 - 0.2 x10E3/uL   Immature Granulocytes 0 Not Estab. %   Immature Grans (Abs) 0.0 0.0 - 0.1 x10E3/uL  Comprehensive metabolic panel  Result Value Ref Range   Glucose 92 70 - 99 mg/dL   BUN 13 6 - 20 mg/dL   Creatinine, Ser 0.85 0.57 - 1.00 mg/dL   eGFR 93 >59 mL/min/1.73   BUN/Creatinine Ratio 15 9 - 23   Sodium 140 134 - 144 mmol/L   Potassium 4.2 3.5 - 5.2 mmol/L   Chloride 101 96 - 106 mmol/L   CO2 25  20 - 29 mmol/L   Calcium 9.2 8.7 - 10.2 mg/dL   Total Protein 7.3 6.0 - 8.5 g/dL   Albumin 4.8 3.8 - 4.8 g/dL   Globulin, Total 2.5 1.5 - 4.5 g/dL   Albumin/Globulin Ratio 1.9 1.2 - 2.2   Bilirubin Total 0.6 0.0 - 1.2 mg/dL   Alkaline Phosphatase 44 44 - 121 IU/L   AST 19 0 - 40 IU/L   ALT 9 0 - 32 IU/L  Lipid Panel w/o Chol/HDL Ratio  Result Value Ref Range   Cholesterol, Total 199 100 - 199 mg/dL   Triglycerides 49 0 - 149 mg/dL   HDL 80 >39 mg/dL   VLDL Cholesterol Cal 9 5 - 40 mg/dL   LDL Chol Calc (NIH) 110 (H) 0 - 99 mg/dL  Urinalysis, Routine w reflex microscopic  Result Value Ref Range   Specific Gravity, UA 1.020 1.005 - 1.030   pH, UA 7.5 5.0 - 7.5   Color, UA Yellow Yellow   Appearance Ur Clear Clear   Leukocytes,UA Negative Negative   Protein,UA Negative Negative/Trace   Glucose, UA Negative Negative   Ketones, UA Negative Negative  RBC, UA Negative Negative   Bilirubin, UA Negative Negative   Urobilinogen, Ur 0.2 0.2 - 1.0 mg/dL   Nitrite, UA Negative Negative  Thyroid Panel With TSH  Result Value Ref Range   TSH 1.360 0.450 - 4.500 uIU/mL   T4, Total 6.9 4.5 - 12.0 ug/dL   T3 Uptake Ratio 25 24 - 39 %   Free Thyroxine Index 1.7 1.2 - 4.9  VITAMIN D 25 Hydroxy (Vit-D Deficiency, Fractures)  Result Value Ref Range   Vit D, 25-Hydroxy 34.2 30.0 - 100.0 ng/mL  B12  Result Value Ref Range   Vitamin B-12 481 232 - 1,245 pg/mL  Estradiol  Result Value Ref Range   Estradiol 167.0 pg/mL  Testosterone, free, total([facility])  Result Value Ref Range   Testosterone 19 8 - 60 ng/dL   Testosterone, Free 5.0 (H) 0.0 - 4.2 pg/mL   Sex Hormone Binding 51.4 24.6 - 122.0 nmol/L  LH  Result Value Ref Range   LH 6.3 mIU/mL  FSH  Result Value Ref Range   FSH 3.6 mIU/mL  Prolactin  Result Value Ref Range   Prolactin 8.9 4.8 - 23.3 ng/mL  Bayer DCA Hb A1c Waived  Result Value Ref Range   HB A1C (BAYER DCA - WAIVED) 4.9 4.8 - 5.6 %  Ferritin  Result Value  Ref Range   Ferritin 48 15 - 150 ng/mL  HIV Antibody (routine testing w rflx)  Result Value Ref Range   HIV Screen 4th Generation wRfx Non Reactive Non Reactive  Hepatitis C Antibody  Result Value Ref Range   Hep C Virus Ab <0.1 0.0 - 0.9 s/co ratio      Assessment & Plan:   Problem List Items Addressed This Visit   None Visit Diagnoses     Routine general medical examination at a health care facility    -  Primary   Vaccines up to date/declined. Screening labs checked today. Pap through GYN, ?due- awaiting records. Continue diet and exercise. Call with any concerns.    Screening for tuberculosis       Labs drawn today. Await results.    Relevant Orders   QuantiFERON-TB Gold Plus        Follow up plan: Return in about 1 year (around 03/04/2022) for Physical.   LABORATORY TESTING:  - Pap smear: done elsewhere- seeing GYN in 2 weeks  IMMUNIZATIONS:   - Tdap: Tetanus vaccination status reviewed: unsure, ?last 9 years ago with her pregnancy. - Influenza: Up to date - Pneumovax: Not applicable - Prevnar: Not applicable - COVID: Refused - HPV: Not applicable - Shingrix vaccine: Not applicable  PATIENT COUNSELING:   Advised to take 1 mg of folate supplement per day if capable of pregnancy.   Sexuality: Discussed sexually transmitted diseases, partner selection, use of condoms, avoidance of unintended pregnancy  and contraceptive alternatives.   Advised to avoid cigarette smoking.  I discussed with the patient that most people either abstain from alcohol or drink within safe limits (<=14/week and <=4 drinks/occasion for males, <=7/weeks and <= 3 drinks/occasion for females) and that the risk for alcohol disorders and other health effects rises proportionally with the number of drinks per week and how often a drinker exceeds daily limits.  Discussed cessation/primary prevention of drug use and availability of treatment for abuse.   Diet: Encouraged to adjust caloric intake  to maintain  or achieve ideal body weight, to reduce intake of dietary saturated fat and total fat, to limit sodium intake by  avoiding high sodium foods and not adding table salt, and to maintain adequate dietary potassium and calcium preferably from fresh fruits, vegetables, and low-fat dairy products.    stressed the importance of regular exercise  Injury prevention: Discussed safety belts, safety helmets, smoke detector, smoking near bedding or upholstery.   Dental health: Discussed importance of regular tooth brushing, flossing, and dental visits.    NEXT PREVENTATIVE PHYSICAL DUE IN 1 YEAR. Return in about 1 year (around 03/04/2022) for Physical.

## 2021-03-06 LAB — QUANTIFERON-TB GOLD PLUS
QuantiFERON Mitogen Value: >10 IU/mL
QuantiFERON Nil Value: 0.01 IU/mL
QuantiFERON TB1 Ag Value: 0.06 IU/mL
QuantiFERON TB2 Ag Value: 0.03 IU/mL
QuantiFERON-TB Gold Plus: NEGATIVE

## 2021-03-15 ENCOUNTER — Other Ambulatory Visit (HOSPITAL_COMMUNITY)
Admission: RE | Admit: 2021-03-15 | Discharge: 2021-03-15 | Disposition: A | Discharge status: Home / Self Care | Payer: 59 | Source: Ambulatory Visit | Attending: Obstetrics and Gynecology | Admitting: Obstetrics and Gynecology

## 2021-03-15 ENCOUNTER — Encounter: Payer: Self-pay | Admitting: Obstetrics and Gynecology

## 2021-03-15 ENCOUNTER — Ambulatory Visit (INDEPENDENT_AMBULATORY_CARE_PROVIDER_SITE_OTHER): Payer: 59 | Admitting: Obstetrics and Gynecology

## 2021-03-15 ENCOUNTER — Other Ambulatory Visit: Payer: Self-pay

## 2021-03-15 VITALS — BP 120/70 | Ht 66.0 in | Wt 187.2 lb

## 2021-03-15 DIAGNOSIS — N771 Vaginitis, vulvitis and vulvovaginitis in diseases classified elsewhere: Secondary | ICD-10-CM | POA: Diagnosis not present

## 2021-03-15 DIAGNOSIS — N939 Abnormal uterine and vaginal bleeding, unspecified: Secondary | ICD-10-CM

## 2021-03-15 DIAGNOSIS — Z298 Encounter for other specified prophylactic measures: Secondary | ICD-10-CM

## 2021-03-15 DIAGNOSIS — Z113 Encounter for screening for infections with a predominantly sexual mode of transmission: Secondary | ICD-10-CM

## 2021-03-15 DIAGNOSIS — Z124 Encounter for screening for malignant neoplasm of cervix: Secondary | ICD-10-CM

## 2021-03-15 NOTE — Patient Instructions (Signed)
Abnormal Uterine Bleeding Abnormal uterine bleeding is unusual bleeding from the uterus. It includes bleeding after sex, or bleeding or spotting between menstrual periods. It may also include bleeding that is heavier than normal, menstrual periods that last longer than usual, or bleeding that occurs after menopause. Abnormal uterine bleeding can affect teenagers, women in their reproductive years, pregnant women, and women who have reached menopause. Common causes of abnormal uterine bleeding include: Pregnancy. Abnormal growths within the lining of the uterus (polyps). Benign tumors or growths in the uterus (fibroids). These are not cancer. Infection. Cancer. Too much or too little of some hormones in the body (hormonal imbalances). Any type of abnormal bleeding should be checked by a health care provider. Many cases are minor and simple to treat, but others may be more serious. Treatment will depend on the cause of the bleeding and how severe it is. Follow these instructions at home: Medicines Take over-the-counter and prescription medicines only as told by your health care provider. Ask your health care provider about: Taking medicines such as aspirin and ibuprofen. These medicines can thin your blood. Do not take these medicines unless your health care provider tells you to take them. Taking over-the-counter medicines, vitamins, herbs, and supplements. If you were prescribed iron pills, take them as told by your health care provider. Iron pills help to replace iron that your body loses because of this condition. Managing constipation In cases of severe bleeding, you may be asked to increase your iron intake to treat anemia. Doing this may cause constipation. To prevent or treat constipation, you may need to: Drink enough fluid to keep your urine pale yellow. Take over-the-counter or prescription medicines. Eat foods that are high in fiber, such as beans, whole grains, and fresh fruits and  vegetables. Limit foods that are high in fat and processed sugars, such as fried or sweet foods. Activity Alter your activity to decrease bleeding if you need to change your sanitary pad more than one time every 2 hours: Lie in bed with your feet raised (elevated). Place a cold pack on your lower abdomen. Rest as much as possible until the bleeding stops or slows down. General instructions Do not use tampons, douche, or have sex until your health care provider says these things are okay. Change your sanitary pads often. Get regular exams. These include pelvic exams and cervical cancer screenings. It is up to you to get the results of any tests that are done. Ask your health care provider, or the department that is doing the tests, when your results will be ready. Monitor your condition for any changes. For 2 months, write down: When your menstrual period starts. When your menstrual period ends. When any abnormal vaginal bleeding occurs. What problems you notice. Keep all follow-up visits. This is important. Contact a health care provider if: You have bleeding that lasts for more than one week. You feel dizzy at times. You feel nauseous or you vomit. You feel light-headed or weak. You notice any other changes that show that your condition is getting worse. Get help right away if: You faint. You have bleeding that soaks through a sanitary pad every hour. You have pain in the abdomen. You have a fever or chills. You become sweaty or weak. You pass large blood clots from your vagina. These symptoms may represent a serious problem that is an emergency. Do not wait to see if the symptoms will go away. Get medical help right away. Call your local emergency services (911   in the [place] Do not drive yourself to the hospital. Summary Abnormal uterine bleeding is unusual bleeding from the uterus. Any type of abnormal bleeding should be checked by a health care provider. Many cases are minor and  simple to treat, but others may be more serious. Treatment will depend on the cause of the bleeding and how severe it is. Get help right away if you faint, you have bleeding that soaks through a sanitary pad every hour, or you pass large blood clots from your vagina. This information is not intended to replace advice given to you by your health care provider. Make sure you discuss any questions you have with your health care provider. Document Revised: 06/12/2020 Document Reviewed: 06/12/2020 Elsevier Patient Education  2022 [employer] Inc[employer]  

## 2021-03-15 NOTE — Progress Notes (Signed)
Patient ID: [name], female   DOB: 18-Aug-1990, 31 y.o.   MRN: [id number]  Reason for Consult: Gynecologic Exam   Referred by Valerie Roys, DO  Subjective:     HPI:  [name] is a 31 y.o. female She presents today with concerns regarding irregular menstrual cycle bleeding.  She reports that she has been having bleeding with intercourse.  She reports that the bleeding will be very heavy after intercourse.  She has noticed that this tends to happen the week before her menstrual cycle in the week after her menstrual cycle.  She has also been having a heavy bleeding event 2 to 3 days before her actual menstrual cycle starts.  She reports that she does not pass large clots. She does have sensations of gushing or flooding of blood.  She does not saturate a pad or tampon more frequently than every hour.  She does have accidents where she bleeds through her clothing.  She does not have severe pain during her menstrual cycle that prevents her from attending work or school.  This irregular bleeding pattern has been happening since the summer 2022.  She started paying more attention since October and taking notes of it.  She does not have her notes with her today.  She has been speaking with her primary care physician regarding how she has felt very low energy for several months.  Her primary care physician did a large lab panel.   Gynecological History  Patient's last menstrual period was 02/28/2021. Menarche: 10 Menopause: not applicable    History of fibroids, polyps, or ovarian cysts? : no  History of PCOS? no Hstory of Endometriosis? no History of abnormal pap smears? no Have you had any sexually transmitted infections in the past? no  She denies HPV vaccination in the past.   Last Pap: 2013  She identifies as a female. She is sexually active with men.   She has some dyspareunia. She admits to postcoital bleeding.  She currently uses vasectomy for contraception.    Obstetrical History OB History  Gravida Para Term Preterm AB Living  4       1 3   SAB IAB Ectopic Multiple Live Births      1   3    # Outcome Date GA Lbr Len/2nd Weight Sex Delivery Anes PTL Lv  4 Gravida 07/26/13    F Vag-Spont   LIV  3 Gravida 02/03/12    F Vag-Spont   LIV  2 Ectopic 2012          1 Gravida 03/20/08    M Vag-Spont   LIV     Past Medical History:  Diagnosis Date   Asthma    in high school   Family History  Problem Relation Age of Onset   Huntington's disease Mother    Depression Mother    Hypertension Father    Huntington's disease Maternal Grandfather    Mental illness Paternal Grandmother    Past Surgical History:  Procedure Laterality Date   LAPAROSCOPY FOR ECTOPIC PREGNANCY  08/09/2010    Short Social History:  Social History   Tobacco Use   Smoking status: Never   Smokeless tobacco: Never  Substance Use Topics   Alcohol use: Yes    Comment: 2 a week    No Known Allergies  No current outpatient medications on file.   No current facility-administered medications for this visit.    Review of Systems  Constitutional: Negative for chills,  fatigue, fever and unexpected weight change.  HENT: Negative for trouble swallowing.  Eyes: Negative for loss of vision.  Respiratory: Negative for cough, shortness of breath and wheezing.  Cardiovascular: Negative for chest pain, leg swelling, palpitations and syncope.  GI: Negative for abdominal pain, blood in stool, diarrhea, nausea and vomiting.  GU: Negative for difficulty urinating, dysuria, frequency and hematuria.  Musculoskeletal: Negative for back pain, leg pain and joint pain.  Skin: Negative for rash.  Neurological: Negative for dizziness, headaches, light-headedness, numbness and seizures.  Psychiatric: Negative for behavioral problem, confusion, depressed mood and sleep disturbance.       Objective:  Objective   Vitals:   03/15/21 0930  BP: 120/70  Weight: 187 lb 3.2 oz (84.9  kg)  Height: 5\' 6"  (1.676 m)   Body mass index is 30.21 kg/m.  Physical Exam Vitals and nursing note reviewed. Exam conducted with a chaperone present.  Constitutional:      Appearance: Normal appearance. She is well-developed.  HENT:     Head: Normocephalic and atraumatic.  Eyes:     Extraocular Movements: Extraocular movements intact.     Pupils: Pupils are equal, round, and reactive to light.  Cardiovascular:     Rate and Rhythm: Normal rate and regular rhythm.  Pulmonary:     Effort: Pulmonary effort is normal. No respiratory distress.     Breath sounds: Normal breath sounds.  Abdominal:     General: Abdomen is flat.     Palpations: Abdomen is soft.  Genitourinary:    Comments: External: Normal appearing vulva. No lesions noted.  Speculum examination: Normal appearing cervix. No blood in the vaginal vault. No discharge.   Bimanual examination: Uterus midline, non-tender, normal in size, shape and contour.  No CMT. No adnexal masses. No adnexal tenderness. Pelvis not fixed.  Breast exam: exam not performed Musculoskeletal:        General: No signs of injury.  Skin:    General: Skin is warm and dry.  Neurological:     Mental Status: She is alert and oriented to person, place, and time.  Psychiatric:        Behavior: Behavior normal.        Thought Content: Thought content normal.        Judgment: Judgment normal.    Assessment/Plan:    31 year old with irregular and heavy bleeding especially after intercourse. Will check pelvic ultrasound Viewed labs which were largely normal Pap smear performed today Collected STD and vaginitis testing.  Follow up after [place] to review results.   Discussed medical management options such as birth control pills.  The patient has had an adverse reaction to birth controls in the past and a poor experience after IUD removal leading to ectopic pregnancy.   More than 30 minutes were spent face to face with the patient in the room,  reviewing the medical record, labs and images, and coordinating care for the patient. The plan of management was discussed in detail and counseling was provided.    Adrian Prows MD [facility], [facility] 03/15/2021 9:49 AM

## 2021-03-18 LAB — NUSWAB BV AND CANDIDA, NAA
Candida albicans, NAA: NEGATIVE
Candida glabrata, NAA: NEGATIVE

## 2021-03-20 LAB — CYTOLOGY - PAP
Chlamydia: NEGATIVE
Comment: NEGATIVE
Comment: NEGATIVE
Comment: NEGATIVE
Comment: NORMAL
Diagnosis: NEGATIVE
High risk HPV: NEGATIVE
Neisseria Gonorrhea: NEGATIVE
Trichomonas: NEGATIVE

## 2021-03-24 NOTE — Progress Notes (Signed)
New Outpatient Visit Date: 03/28/2021  Referring Provider: Meredith, Kilbride, DO [address] [place],  [place] [address]  Chief Complaint: Slow heart rate, fatigue, and inability to lose weight  HPI:  [name] is a 31 y.o. female who is being seen today for the evaluation of bradycardia at the request of Dr. Laural Benes. She has a history of childhood asthma but otherwise no significant past medical history (mild hyperlipidemia noted on recent labs).  She was seen by Dr. Laural Benes in late 01/2021 to establish care.  At the time, she complained of numbness in her arms at night as well as difficulty losing weight.  She was referred for evaluation of bradycardia that was incidentally noted (vitals note heart rate 56 bpm, no EKG performed).  Today, Ms. [name] is most concerned about low heart rates, often in the 50s at rest per her smart watch.  She worries that this is contributing to her fatigue and inability to lose weight.  Since her youngest daughter was born about 8 years ago, he she has been unable to lose the last 20 pounds that she gained during her pregnancy.  She exercises regularly, often running to the point of her heart rate going up to 190 bpm.  She also does resistance training.  Despite this regular exercise, she feels some fatigue.  She does not snore nor has anyone ever mentioned that she stops breathing.  She inquires about utility of Adderall to help give her more energy.  [name] also reports intermittent "pinching" in her chest that can occur throughout the day (both at rest and when exercising).  The sensation often lasts a few minutes and is reminiscent of a cramp in her heart.  At times, she gets lightheaded when she exercises, particularly when her heart rate is in the 180s.  She also has a sensation of blacking out at times after her workout.  She notices her vision going dark and becomes tingly throughout her body.  She does not completely pass out but thinks it would be difficult  for her to speak to anyone.  She reports frank syncope as a child/adolescent but not in several years.  She denies shortness of breath as well as palpitations at rest.  She has not had any orthopnea or edema.  [name] is also concerned about numbness in both arms that occurs when she lies in bed at night.  She typically sleeps with her arms by her side and wakes up with aforementioned paresthesias.  She does not have any pain in her arms nor numbness/tingling when moving about during the day or when raising her arms.  --------------------------------------------------------------------------------------------------  Cardiovascular History & Procedures: Cardiovascular Problems: Sinus bradycardia Chest pain Lightheadedness/near syncope  Risk Factors: Hyperlipidemia  Cath/PCI: None  CV Surgery: None  EP Procedures and Devices: None  Non-Invasive Evaluation(s): None  Recent CV Pertinent Labs: Lab Results  Component Value Date   CHOL 199 02/13/2021   HDL 80 02/13/2021   LDLCALC 110 (H) 02/13/2021   TRIG 49 02/13/2021   K 4.2 02/13/2021   BUN 13 02/13/2021   CREATININE 0.85 02/13/2021    --------------------------------------------------------------------------------------------------  Past Medical History:  Diagnosis Date   Asthma    in high school    Past Surgical History:  Procedure Laterality Date   LAPAROSCOPY FOR ECTOPIC PREGNANCY  08/09/2010    Current Meds  Medication Sig   Multiple Vitamin (MULTIVITAMIN) tablet Take 1 tablet by mouth daily.    Allergies: Patient has  no known allergies.  Social History   Tobacco Use   Smoking status: Never   Smokeless tobacco: Never  Vaping Use   Vaping Use: Never used  Substance Use Topics   Alcohol use: Yes    Comment: 2 a week   Drug use: No    Family History  Problem Relation Age of Onset   Huntington's disease Mother    Depression Mother    Hypertension Father    Huntington's disease Maternal  Grandfather    Mental illness Paternal Grandmother    Hyperlipidemia Paternal Grandfather    Coronary artery disease Paternal Grandfather    Sudden Cardiac Death Neg Hx     Review of Systems: A 12-system review of systems was performed and was negative except as noted in the HPI.  --------------------------------------------------------------------------------------------------  Physical Exam: BP 116/82 (BP Location: Right Arm, Patient Position: Sitting, Cuff Size: Normal)    Pulse 61    Ht 5\' 6"  (1.676 m)    Wt 185 lb (83.9 kg)    LMP 02/28/2021    SpO2 99%    BMI 29.86 kg/m   General:  NAD. HEENT: No conjunctival pallor or scleral icterus. Facemask in place. Neck: Supple without lymphadenopathy, thyromegaly, JVD, or HJR. No carotid bruit. Lungs: Normal work of breathing. Clear to auscultation bilaterally without wheezes or crackles. Heart: Regular rate and rhythm without murmurs, rubs, or gallops. Non-displaced PMI. Abd: Bowel sounds present. Soft, NT/ND without hepatosplenomegaly Ext: No lower extremity edema. Radial, PT, and DP pulses are 2+ bilaterally Skin: Warm and dry without rash. Neuro: CNIII-XII intact. Strength and fine-touch sensation intact in upper and lower extremities bilaterally. Psych: Normal mood and affect.  EKG: Normal sinus rhythm with sinus arrhythmia.  No abnormality.  Lab Results  Component Value Date   WBC 5.2 02/13/2021   HGB 15.0 02/13/2021   HCT 44.5 02/13/2021   MCV 88 02/13/2021   PLT 214 02/13/2021    Lab Results  Component Value Date   NA 140 02/13/2021   K 4.2 02/13/2021   CL 101 02/13/2021   CO2 25 02/13/2021   BUN 13 02/13/2021   CREATININE 0.85 02/13/2021   GLUCOSE 92 02/13/2021   ALT 9 02/13/2021    Lab Results  Component Value Date   CHOL 199 02/13/2021   HDL 80 02/13/2021   LDLCALC 110 (H) 02/13/2021   TRIG 49 02/13/2021   Lab Results  Component Value Date   TSH 1.360 02/13/2021     --------------------------------------------------------------------------------------------------  ASSESSMENT AND PLAN: Sinus bradycardia and fatigue: Patient's resting bradycardia is likely a manifestation of good cardiovascular conditioning.  Her EKG today is normal.  Recent thyroid function tests were normal.  I reassured [name] that her resting bradycardia is not pathologic.  I do not think that a stimulant such as Adderall is indicated.  Chest pain and lightheadedness/near syncope: Pain is atypical in nature.  Cardiac risk factors are limited to mild hyperlipidemia noted on recent labs.  Given reports of near syncope with/immediately after exercise, I have recommended obtaining a transthoracic echocardiogram to exclude structural abnormalities, though exam and EKG are not consistent with underlying abnormality such as bicuspid aortic valve/aortic stenosis or hypertrophic cardiomyopathy.  If symptoms persist, we could consider an exercise tolerance test to assess heart rate and blood pressure responses to exercise as well as to exclude ischemic changes that could suggest an anomalous coronary artery.  I think it is very unlikely that [name] has underlying ASCVD.  Paresthesias: [name] is  concerned about numbness in both arms when asleep.  She does not have any symptoms when elevating her arms.  Cardiovascular and neurologic exams unremarkable today.  She had an MRI of the cervical spine in 2011, which I reviewed today, which did not show any significant abnormalities.  I do not think that her symptoms are primarily cardiovascular in nature.  Presentation would be unusual for bilateral thoracic outlet syndrome.  If symptoms persist, further evaluation by neurology may be helpful.  Follow-up: Return to clinic as needed based on symptoms and results of echocardiogram (Ms. Shinn wishes to defer scheduled follow-up for financial reasons).  Yvonne Kendall, MD 03/30/2021 11:20 AM

## 2021-03-28 ENCOUNTER — Other Ambulatory Visit: Payer: Self-pay

## 2021-03-28 ENCOUNTER — Ambulatory Visit (INDEPENDENT_AMBULATORY_CARE_PROVIDER_SITE_OTHER): Payer: 59 | Admitting: Internal Medicine

## 2021-03-28 ENCOUNTER — Encounter: Payer: Self-pay | Admitting: Internal Medicine

## 2021-03-28 VITALS — BP 116/82 | HR 61 | Ht 66.0 in | Wt 185.0 lb

## 2021-03-28 DIAGNOSIS — R001 Bradycardia, unspecified: Secondary | ICD-10-CM | POA: Diagnosis not present

## 2021-03-28 DIAGNOSIS — R202 Paresthesia of skin: Secondary | ICD-10-CM

## 2021-03-28 DIAGNOSIS — R079 Chest pain, unspecified: Secondary | ICD-10-CM

## 2021-03-28 DIAGNOSIS — R55 Syncope and collapse: Secondary | ICD-10-CM

## 2021-03-28 DIAGNOSIS — R5383 Other fatigue: Secondary | ICD-10-CM

## 2021-03-28 NOTE — Patient Instructions (Signed)
Medication Instructions:  ° °Your physician recommends that you continue on your current medications as directed. Please refer to the Current Medication list given to you today. ° °*If you need a refill on your cardiac medications before your next appointment, please call your pharmacy* ° ° °Lab Work: ° °None ordered ° °Testing/Procedures: ° °Your physician has requested that you have an echocardiogram. Echocardiography is a painless test that uses sound waves to create images of your heart. It provides your doctor with information about the size and shape of your heart and how well your heart’s chambers and valves are working. This procedure takes approximately one hour. There are no restrictions for this procedure. ° ° °Follow-Up: °At [facility], you and your health needs are our priority.  As part of our continuing mission to provide you with exceptional heart care, we have created designated Provider Care Teams.  These Care Teams include your primary Cardiologist (physician) and Advanced Practice Providers (APPs -  Physician Assistants and Nurse Practitioners) who all work together to provide you with the care you need, when you need it. ° °We recommend signing up for the patient portal called "MyChart".  Sign up information is provided on this After Visit Summary.  MyChart is used to connect with patients for Virtual Visits (Telemedicine).  Patients are able to view lab/test results, encounter notes, upcoming appointments, etc.  Non-urgent messages can be sent to your provider as well.   °To learn more about what you can do with MyChart, go to [email or web address].   ° °Your next appointment:   ° °Follow up as needed ° ° ° °

## 2021-03-29 ENCOUNTER — Encounter: Payer: Self-pay | Admitting: Obstetrics and Gynecology

## 2021-03-30 ENCOUNTER — Encounter: Payer: Self-pay | Admitting: Internal Medicine

## 2021-03-30 DIAGNOSIS — R55 Syncope and collapse: Secondary | ICD-10-CM | POA: Insufficient documentation

## 2021-03-30 DIAGNOSIS — R202 Paresthesia of skin: Secondary | ICD-10-CM | POA: Insufficient documentation

## 2021-03-30 DIAGNOSIS — R079 Chest pain, unspecified: Secondary | ICD-10-CM | POA: Insufficient documentation

## 2021-03-30 DIAGNOSIS — R5383 Other fatigue: Secondary | ICD-10-CM | POA: Insufficient documentation

## 2021-04-01 ENCOUNTER — Other Ambulatory Visit: Payer: Self-pay

## 2021-04-01 ENCOUNTER — Ambulatory Visit
Admission: RE | Admit: 2021-04-01 | Discharge: 2021-04-01 | Disposition: A | Discharge status: Home / Self Care | Payer: 59 | Source: Ambulatory Visit | Attending: Obstetrics and Gynecology | Admitting: Obstetrics and Gynecology

## 2021-04-01 DIAGNOSIS — N939 Abnormal uterine and vaginal bleeding, unspecified: Secondary | ICD-10-CM | POA: Diagnosis not present

## 2021-04-04 ENCOUNTER — Ambulatory Visit: Payer: 59 | Admitting: Obstetrics and Gynecology

## 2021-04-09 ENCOUNTER — Encounter: Payer: 59 | Admitting: Family Medicine

## 2021-04-12 ENCOUNTER — Ambulatory Visit (INDEPENDENT_AMBULATORY_CARE_PROVIDER_SITE_OTHER): Payer: 59

## 2021-04-12 ENCOUNTER — Other Ambulatory Visit: Payer: Self-pay

## 2021-04-12 DIAGNOSIS — R5383 Other fatigue: Secondary | ICD-10-CM | POA: Diagnosis not present

## 2021-04-12 DIAGNOSIS — R55 Syncope and collapse: Secondary | ICD-10-CM | POA: Diagnosis not present

## 2021-04-12 DIAGNOSIS — R001 Bradycardia, unspecified: Secondary | ICD-10-CM

## 2021-04-12 LAB — ECHOCARDIOGRAM COMPLETE
AR max vel: 1.99 cm2
AV Area VTI: 2.15 cm2
AV Area mean vel: 2.03 cm2
AV Mean grad: 6 mmHg
AV Peak grad: 12 mmHg
Ao pk vel: 1.73 m/s
Area-P 1/2: 3.34 cm2
Calc EF: 57.4 %
S' Lateral: 3.7 cm
Single Plane A2C EF: 57.5 %
Single Plane A4C EF: 57.2 %

## 2021-04-20 ENCOUNTER — Encounter: Payer: Self-pay | Admitting: Internal Medicine

## 2021-04-22 ENCOUNTER — Telehealth: Payer: Self-pay | Admitting: *Deleted

## 2021-04-22 NOTE — Telephone Encounter (Signed)
Spoke with patient and reviewed results and recommendations. Discussed findings, recommendations, and answered all of her questions. Encouraged her to call back if she should have any further questions and if she has new or worsening symptoms to let us know. She was appreciative for the call back. She verbalized understanding of our conversation, agreement with plan, and had no further questions at this time.

## 2021-04-22 NOTE — Telephone Encounter (Signed)
Left voicemail message to call back for review of results and answer any questions she may have.   See My Chart message.    Arnold Long  P Cv Div [facility] Triage (supporting End, Cristal Deer, MD) 2 days ago   Good evening, I have been trying to understand what my results were but when I spoke with the nurse. She was talking quickly and I didn't understand. What I understood was my left ventricle is leaky and I need to come back in a year?  If you could please help me understand understand my results I would be so grateful.

## 2021-06-21 ENCOUNTER — Encounter: Payer: Self-pay | Admitting: Family Medicine

## 2021-06-26 ENCOUNTER — Encounter: Payer: Self-pay | Admitting: Family Medicine

## 2021-06-26 ENCOUNTER — Ambulatory Visit (INDEPENDENT_AMBULATORY_CARE_PROVIDER_SITE_OTHER): Payer: 59 | Admitting: Family Medicine

## 2021-06-26 VITALS — BP 109/73 | HR 59 | Temp 97.9°F | Wt 183.2 lb

## 2021-06-26 DIAGNOSIS — E663 Overweight: Secondary | ICD-10-CM | POA: Diagnosis not present

## 2021-06-26 MED ORDER — BUPROPION HCL ER (SR) 150 MG PO TB12: ORAL_TABLET | ORAL | 3 refills | Status: DC

## 2021-06-26 NOTE — Assessment & Plan Note (Signed)
Will start wellbutrin to help with weight and worry. Call with any concerns. Check for tolerance in 1 month.  ?

## 2021-06-26 NOTE — Progress Notes (Signed)
? ?BP 109/73   Pulse (!) 59   Temp 97.9 ?F (36.6 ?C)   Wt 183 lb 3.2 oz (83.1 kg)   SpO2 100%   BMI 29.57 kg/m?   ? ?Subjective:  ? ? Patient ID: [name], female    DOB: 01/31/1991, 31 y.o.   MRN: [id number] ? ?HPI: ?[name] is a 31 y.o. female ? ?Chief Complaint  ?Patient presents with  ? Bradycardia  ?  Patient was seen by cardiology and nothing abnormal was found per patient, patient would like to discuss stimulant options.   ? Obesity  ?  Patient would like to discuss stimulant options due to slow metabolism   ? ?She is not happy with her weight. She notes that she was not very happy with the cardiology appointment. She was happy that there was nothing wrong going on, but she doesn't feel like she is working out hard enough for her HR to be so low. She notes that since Feb has been running 2 miles 2-3x a week and adding another exercise routine 1-2 other times. She has been trying to maintain her weight and is very frustrated that she has not been able to drop more significant weight. She is down 4lbs in the last 4 months. She is concerned about her metabolism and is frustrated that she has not been able to lose more weight.  ? ?OVERWEIGHT ?Duration: chronic ?Previous attempts at weight loss: yes ?Complications of obesity: none ?Peak weight: 195 ?Weight loss goal: 130s  ?Weight loss to date: 12lbs ?Requesting obesity pharmacotherapy: yes ?Current weight loss supplements/medications: no ?Previous weight loss supplements/meds: no ? ?Relevant past medical, surgical, family and social history reviewed and updated as indicated. Interim medical history since our last visit reviewed. ?Allergies and medications reviewed and updated. ? ?Review of Systems  ?Constitutional: Negative.   ?Respiratory: Negative.    ?Cardiovascular: Negative.   ?Musculoskeletal: Negative.   ?Skin: Negative.   ?Psychiatric/Behavioral: Negative.    ? ?Per HPI unless specifically indicated above ? ?   ?Objective:  ?  ?BP  109/73   Pulse (!) 59   Temp 97.9 ?F (36.6 ?C)   Wt 183 lb 3.2 oz (83.1 kg)   SpO2 100%   BMI 29.57 kg/m?   ?Wt Readings from Last 3 Encounters:  ?06/26/21 183 lb 3.2 oz (83.1 kg)  ?03/28/21 185 lb (83.9 kg)  ?03/15/21 187 lb 3.2 oz (84.9 kg)  ?  ?Physical Exam ?Vitals and nursing note reviewed.  ?Constitutional:   ?   General: She is not in acute distress. ?   Appearance: Normal appearance. She is not ill-appearing, toxic-appearing or diaphoretic.  ?HENT:  ?   Head: Normocephalic and atraumatic.  ?   Right Ear: External ear normal.  ?   Left Ear: External ear normal.  ?   Nose: Nose normal.  ?   Mouth/Throat:  ?   Mouth: Mucous membranes are moist.  ?   Pharynx: Oropharynx is clear.  ?Eyes:  ?   General: No scleral icterus.    ?   Right eye: No discharge.     ?   Left eye: No discharge.  ?   Extraocular Movements: Extraocular movements intact.  ?   Conjunctiva/sclera: Conjunctivae normal.  ?   Pupils: Pupils are equal, round, and reactive to light.  ?Cardiovascular:  ?   Rate and Rhythm: Normal rate and regular rhythm.  ?   Pulses: Normal pulses.  ?   Heart sounds: Normal  heart sounds. No murmur heard. ?  No friction rub. No gallop.  ?Pulmonary:  ?   Effort: Pulmonary effort is normal. No respiratory distress.  ?   Breath sounds: Normal breath sounds. No stridor. No wheezing, rhonchi or rales.  ?Chest:  ?   Chest wall: No tenderness.  ?Musculoskeletal:     ?   General: Normal range of motion.  ?   Cervical back: Normal range of motion and neck supple.  ?Skin: ?   General: Skin is warm and dry.  ?   Capillary Refill: Capillary refill takes less than 2 seconds.  ?   Coloration: Skin is not jaundiced or pale.  ?   Findings: No bruising, erythema, lesion or rash.  ?Neurological:  ?   General: No focal deficit present.  ?   Mental Status: She is alert and oriented to person, place, and time. Mental status is at baseline.  ?Psychiatric:     ?   Mood and Affect: Mood normal.     ?   Behavior: Behavior normal.     ?    Thought Content: Thought content normal.     ?   Judgment: Judgment normal.  ? ? ?Results for orders placed or performed in visit on 04/12/21  ?ECHOCARDIOGRAM COMPLETE  ?Result Value Ref Range  ? AR max vel 1.99 cm2  ? AV Peak grad 12.0 mmHg  ? Ao pk vel 1.73 m/s  ? S' Lateral 3.70 cm  ? Area-P 1/2 3.34 cm2  ? AV Area VTI 2.15 cm2  ? AV Mean grad 6.0 mmHg  ? Single Plane A4C EF 57.2 %  ? Single Plane A2C EF 57.5 %  ? Calc EF 57.4 %  ? AV Area mean vel 2.03 cm2  ? ?   ?Assessment & Plan:  ? ?Problem List Items Addressed This Visit   ? ?  ? Other  ? Overweight - Primary  ?  Will start wellbutrin to help with weight and worry. Call with any concerns. Check for tolerance in 1 month.  ? ?  ?  ?  ? ?Follow up plan: ?Return in about 4 weeks (around 07/24/2021) for virtual OK. ? ? ? ? ? ?

## 2021-07-24 ENCOUNTER — Encounter: Payer: Self-pay | Admitting: Family Medicine

## 2021-07-24 ENCOUNTER — Telehealth (INDEPENDENT_AMBULATORY_CARE_PROVIDER_SITE_OTHER): Payer: 59 | Admitting: Family Medicine

## 2021-07-24 DIAGNOSIS — E663 Overweight: Secondary | ICD-10-CM

## 2021-07-24 MED ORDER — BUPROPION HCL ER (SR) 150 MG PO TB12: 150.0000 mg | ORAL_TABLET | Freq: Two times a day (BID) | ORAL | 1 refills | Status: DC

## 2021-07-24 NOTE — Progress Notes (Signed)
LVM asking patient to call back to schedule an appt 

## 2021-07-24 NOTE — Progress Notes (Signed)
Wt 175 lb (79.4 kg)   LMP  (LMP Unknown)   BMI 28.25 kg/m    Subjective:    Patient ID: [name], female    DOB: 10-10-1990, 31 y.o.   MRN: [id number]  HPI: [name] is a 31 y.o. female  Chief Complaint  Patient presents with   Obesity   Feeling so much better! She notes that she didn't know how much she was struggling until she started to feel better. Worry is better. She is smiling more. She is feeling so much more like herself  OVERWEIGHT Duration: chronic Previous attempts at weight loss: yes Complications of obesity: none Peak weight: 195lb Weight loss goal: 130s Weight loss to date: 20lbs Requesting obesity pharmacotherapy: yes Current weight loss supplements/medications: yes Previous weight loss supplements/meds: no   Relevant past medical, surgical, family and social history reviewed and updated as indicated. Interim medical history since our last visit reviewed. Allergies and medications reviewed and updated.  Review of Systems  Constitutional: Negative.   Respiratory: Negative.    Cardiovascular: Negative.   Gastrointestinal: Negative.   Musculoskeletal: Negative.   Neurological: Negative.   Psychiatric/Behavioral: Negative.     Per HPI unless specifically indicated above     Objective:    Wt 175 lb (79.4 kg)   LMP  (LMP Unknown)   BMI 28.25 kg/m   Wt Readings from Last 3 Encounters:  07/24/21 175 lb (79.4 kg)  06/26/21 183 lb 3.2 oz (83.1 kg)  03/28/21 185 lb (83.9 kg)    Physical Exam Vitals and nursing note reviewed.  Constitutional:      General: She is not in acute distress.    Appearance: Normal appearance. She is not ill-appearing, toxic-appearing or diaphoretic.  HENT:     Head: Normocephalic and atraumatic.     Right Ear: External ear normal.     Left Ear: External ear normal.     Nose: Nose normal.     Mouth/Throat:     Mouth: Mucous membranes are moist.     Pharynx: Oropharynx is clear.  Eyes:     General: No  scleral icterus.       Right eye: No discharge.        Left eye: No discharge.     Conjunctiva/sclera: Conjunctivae normal.     Pupils: Pupils are equal, round, and reactive to light.  Pulmonary:     Effort: Pulmonary effort is normal. No respiratory distress.     Comments: Speaking in full sentences Musculoskeletal:        General: Normal range of motion.     Cervical back: Normal range of motion.  Skin:    Coloration: Skin is not jaundiced or pale.     Findings: No bruising, erythema, lesion or rash.  Neurological:     Mental Status: She is alert and oriented to person, place, and time. Mental status is at baseline.  Psychiatric:        Mood and Affect: Mood normal.        Behavior: Behavior normal.        Thought Content: Thought content normal.        Judgment: Judgment normal.    Results for orders placed or performed in visit on 04/12/21  ECHOCARDIOGRAM COMPLETE  Result Value Ref Range   AR max vel 1.99 cm2   AV Peak grad 12.0 mmHg   Ao pk vel 1.73 m/s   S' Lateral 3.70 cm   Area-P 1/2 3.34 cm2  AV Area VTI 2.15 cm2   AV Mean grad 6.0 mmHg   Single Plane A4C EF 57.2 %   Single Plane A2C EF 57.5 %   Calc EF 57.4 %   AV Area mean vel 2.03 cm2      Assessment & Plan:   Problem List Items Addressed This Visit       Other   Overweight    Feeling 100% better! Worry has improved and weight is down 8lbs in the last month. Continue current regimen. Continue to monitor. Call with any concerns.          Follow up plan: Return in about 6 months (around 01/23/2022).    This visit was completed via video visit through MyChart due to the restrictions of the COVID-19 pandemic. All issues as above were discussed and addressed. Physical exam was done as above through visual confirmation on video through MyChart. If it was felt that the patient should be evaluated in the office, they were directed there. The patient verbally consented to this visit. Location of the  patient: home Location of the provider: work Those involved with this call:  Provider: Park Liter, DO CMA: Louanna Raw, [place] Desk/Registration: [employer]  Time spent on call:  15 minutes with patient face to face via video conference. More than 50% of this time was spent in counseling and coordination of care. 23 minutes total spent in review of patient's record and preparation of their chart.

## 2021-07-24 NOTE — Assessment & Plan Note (Signed)
Feeling 100% better! Worry has improved and weight is down 8lbs in the last month. Continue current regimen. Continue to monitor. Call with any concerns.

## 2022-01-23 ENCOUNTER — Ambulatory Visit: Payer: 59 | Admitting: Family Medicine

## 2022-01-27 ENCOUNTER — Ambulatory Visit: Admission: EM | Admit: 2022-01-27 | Discharge: 2022-01-27 | Discharge status: Against Medical Advice | Payer: 59

## 2022-01-29 ENCOUNTER — Ambulatory Visit (INDEPENDENT_AMBULATORY_CARE_PROVIDER_SITE_OTHER): Payer: 59 | Admitting: Family Medicine

## 2022-01-29 ENCOUNTER — Encounter: Payer: Self-pay | Admitting: Family Medicine

## 2022-01-29 VITALS — BP 112/76 | HR 58 | Temp 98.3°F | Ht 66.0 in | Wt 168.3 lb

## 2022-01-29 DIAGNOSIS — Z820 Family history of epilepsy and other diseases of the nervous system: Secondary | ICD-10-CM

## 2022-01-29 DIAGNOSIS — E663 Overweight: Secondary | ICD-10-CM

## 2022-01-29 MED ORDER — BUPROPION HCL ER (SR) 150 MG PO TB12: 150.0000 mg | ORAL_TABLET | Freq: Two times a day (BID) | ORAL | 1 refills | Status: DC

## 2022-01-29 NOTE — Assessment & Plan Note (Signed)
Down another 7lbs! Feeling well. Continue wellbutrin. Continue to monitor. Call with any concerns.

## 2022-01-29 NOTE — Assessment & Plan Note (Signed)
Not interested in seeing genetics at this time. Will let us know if she would like to.

## 2022-01-29 NOTE — Progress Notes (Signed)
BP 112/76   Pulse (!) 58   Temp 98.3 F (36.8 C) (Oral)   Ht 5\' 6"  (1.676 m)   Wt 168 lb 4.8 oz (76.3 kg)   SpO2 98%   BMI 27.16 kg/m    Subjective:    Patient ID: , female    DOB: Aug 13, 1990, 31 y.o.   MRN: [id number]  HPI: [name] is a 31 y.o. female  Chief Complaint  Patient presents with   Medication Management    Patient says she would like to discuss a supplement with provider at today's visit.    OVERWEIGHT Duration: chronic Previous attempts at weight loss:  yes Complications of obesity: none Peak weight: 195 Weight loss goal: 130s Weight loss to date: 15lbs since starting medicine Requesting obesity pharmacotherapy: yes Current weight loss supplements/medications: yes Previous weight loss supplements/meds: no     01/29/2022   11:16 AM 07/24/2021    1:37 PM 06/26/2021    1:29 PM 03/15/2021    9:43 AM 03/04/2021    8:17 AM  Depression screen PHQ 2/9  Decreased Interest 0 0 0 1 0  Down, Depressed, Hopeless 0 0 0 1 0  PHQ - 2 Score 0 0 0 2 0  Altered sleeping 1 0 1 1 0  Tired, decreased energy 0 0 1 1 1   Change in appetite 0 0 0 0 0  Feeling bad or failure about yourself  0 0 1 1 0  Trouble concentrating 0 0 0 0 0  Moving slowly or fidgety/restless 0 0 0 0 0  Suicidal thoughts 0 0 0 0 0  PHQ-9 Score 1 0 3 5 1   Difficult doing work/chores Not difficult at all   Not difficult at all      Relevant past medical, surgical, family and social history reviewed and updated as indicated. Interim medical history since our last visit reviewed. Allergies and medications reviewed and updated.  Review of Systems  Constitutional: Negative.   Respiratory: Negative.    Cardiovascular: Negative.   Musculoskeletal: Negative.   Neurological: Negative.   Psychiatric/Behavioral: Negative.      Per HPI unless specifically indicated above     Objective:    BP 112/76   Pulse (!) 58   Temp 98.3 F (36.8 C) (Oral)   Ht 5\' 6"  (1.676 m)   Wt 168  lb 4.8 oz (76.3 kg)   SpO2 98%   BMI 27.16 kg/m   Wt Readings from Last 3 Encounters:  01/29/22 168 lb 4.8 oz (76.3 kg)  07/24/21 175 lb (79.4 kg)  06/26/21 183 lb 3.2 oz (83.1 kg)    Physical Exam Vitals and nursing note reviewed.  Constitutional:      General: She is not in acute distress.    Appearance: Normal appearance. She is not ill-appearing, toxic-appearing or diaphoretic.  HENT:     Head: Normocephalic and atraumatic.     Right Ear: External ear normal.     Left Ear: External ear normal.     Nose: Nose normal.     Mouth/Throat:     Mouth: Mucous membranes are moist.     Pharynx: Oropharynx is clear.  Eyes:     General: No scleral icterus.       Right eye: No discharge.        Left eye: No discharge.     Extraocular Movements: Extraocular movements intact.     Conjunctiva/sclera: Conjunctivae normal.     Pupils: Pupils are equal,  round, and reactive to light.  Cardiovascular:     Rate and Rhythm: Normal rate and regular rhythm.     Pulses: Normal pulses.     Heart sounds: Normal heart sounds. No murmur heard.    No friction rub. No gallop.  Pulmonary:     Effort: Pulmonary effort is normal. No respiratory distress.     Breath sounds: Normal breath sounds. No stridor. No wheezing, rhonchi or rales.  Chest:     Chest wall: No tenderness.  Musculoskeletal:        General: Normal range of motion.     Cervical back: Normal range of motion and neck supple.  Skin:    General: Skin is warm and dry.     Capillary Refill: Capillary refill takes less than 2 seconds.     Coloration: Skin is not jaundiced or pale.     Findings: No bruising, erythema, lesion or rash.  Neurological:     General: No focal deficit present.     Mental Status: She is alert and oriented to person, place, and time. Mental status is at baseline.  Psychiatric:        Mood and Affect: Mood normal.        Behavior: Behavior normal.        Thought Content: Thought content normal.        Judgment:  Judgment normal.     Results for orders placed or performed in visit on 04/12/21  ECHOCARDIOGRAM COMPLETE  Result Value Ref Range   AR max vel 1.99 cm2   AV Peak grad 12.0 mmHg   Ao pk vel 1.73 m/s   S' Lateral 3.70 cm   Area-P 1/2 3.34 cm2   AV Area VTI 2.15 cm2   AV Mean grad 6.0 mmHg   Single Plane A4C EF 57.2 %   Single Plane A2C EF 57.5 %   Calc EF 57.4 %   AV Area mean vel 2.03 cm2      Assessment & Plan:   Problem List Items Addressed This Visit       Other   Overweight    Down another 7lbs! Feeling well. Continue wellbutrin. Continue to monitor. Call with any concerns.       Family history of Huntington's chorea - Primary    Not interested in seeing genetics at this time. Will let us know if she would like to.         Follow up plan: Return in about 6 months (around 07/31/2022) for physical.

## 2022-03-10 ENCOUNTER — Encounter: Payer: 59 | Admitting: Family Medicine

## 2022-03-19 ENCOUNTER — Encounter: Payer: Self-pay | Admitting: Family Medicine

## 2022-04-06 ENCOUNTER — Other Ambulatory Visit: Payer: Self-pay | Admitting: Family Medicine

## 2022-04-07 NOTE — Telephone Encounter (Signed)
Unable to refill per protocol, Rx request is too soon. Last refill 01/29/22 for 90 days and 1 refill.  Requested Prescriptions  Pending Prescriptions Disp Refills   buPROPion (WELLBUTRIN SR) 150 MG 12 hr tablet [Pharmacy Med Name: BUPROPION SR 150MG TABLETS (12 H)] 180 tablet 1    Sig: TAKE 1 TABLET(150 MG) BY MOUTH TWICE DAILY     Psychiatry: Antidepressants - bupropion Failed - 04/06/2022 10:25 AM      Failed - Cr in normal range and within 360 days    Creatinine, Ser  Date Value Ref Range Status  02/13/2021 0.85 0.57 - 1.00 mg/dL Final         Failed - AST in normal range and within 360 days    AST  Date Value Ref Range Status  02/13/2021 19 0 - 40 IU/L Final         Failed - ALT in normal range and within 360 days    ALT  Date Value Ref Range Status  02/13/2021 9 0 - 32 IU/L Final         Passed - Last BP in normal range    BP Readings from Last 1 Encounters:  01/29/22 112/76         Passed - Valid encounter within last 6 months    Recent Outpatient Visits           2 months ago Family history of Huntington's chorea   [place], [place], DO   8 months ago Overweight   [place], [place], DO   9 months ago [place], [place], DO   1 year ago Routine general medical examination at a health care facility   [place], [place], DO   1 year ago Bradycardia   [place], [place], DO       Future Appointments             In 3 months Wynetta Emery, Barb Merino, DO [place]

## 2022-04-08 ENCOUNTER — Encounter: Payer: Self-pay | Admitting: Family Medicine

## 2022-04-10 ENCOUNTER — Encounter: Payer: 59 | Admitting: Family Medicine

## 2022-05-02 ENCOUNTER — Ambulatory Visit: Payer: 59 | Admitting: Internal Medicine

## 2022-05-20 ENCOUNTER — Encounter: Payer: Self-pay | Admitting: Family Medicine

## 2022-05-20 ENCOUNTER — Ambulatory Visit (INDEPENDENT_AMBULATORY_CARE_PROVIDER_SITE_OTHER): Payer: 59 | Admitting: Family Medicine

## 2022-05-20 VITALS — BP 105/68 | HR 52 | Temp 97.6°F | Ht 66.0 in | Wt 170.2 lb

## 2022-05-20 DIAGNOSIS — Z0000 Encounter for general adult medical examination without abnormal findings: Secondary | ICD-10-CM | POA: Diagnosis not present

## 2022-05-20 LAB — URINALYSIS, ROUTINE W REFLEX MICROSCOPIC
Bilirubin, UA: NEGATIVE
Glucose, UA: NEGATIVE
Ketones, UA: NEGATIVE
Leukocytes,UA: NEGATIVE
Nitrite, UA: NEGATIVE
Protein,UA: NEGATIVE
Specific Gravity, UA: >1.03 — ABNORMAL HIGH (ref 1.005–1.030)
Urobilinogen, Ur: 0.2 mg/dL (ref 0.2–1.0)
pH, UA: 5.5 (ref 5.0–7.5)

## 2022-05-20 LAB — MICROSCOPIC EXAMINATION
Bacteria, UA: NONE SEEN
WBC, UA: NONE SEEN /hpf (ref 0–5)

## 2022-05-20 MED ORDER — BUPROPION HCL ER (SR) 150 MG PO TB12: 150.0000 mg | ORAL_TABLET | Freq: Two times a day (BID) | ORAL | 1 refills | Status: DC

## 2022-05-20 NOTE — Progress Notes (Signed)
BP 105/68   Pulse (!) 52   Temp 97.6 F (36.4 C) (Oral)   Ht 5\' 6"  (1.676 m)   Wt 170 lb 3.2 oz (77.2 kg)   SpO2 99%   BMI 27.47 kg/m    Subjective:    Patient ID: [name], female    DOB: 1991/01/01, 32 y.o.   MRN: [id number]  HPI: [name] is a 32 y.o. female presenting on 05/20/2022 for comprehensive medical examination. Current medical complaints include:  She currently lives with: husband and kids Menopausal Symptoms: no  Depression Screen done today and results listed below:     05/20/2022    3:54 PM 01/29/2022   11:16 AM 07/24/2021    1:37 PM 06/26/2021    1:29 PM 03/15/2021    9:43 AM  Depression screen PHQ 2/9  Decreased Interest 0 0 0 0 1  Down, Depressed, Hopeless 0 0 0 0 1  PHQ - 2 Score 0 0 0 0 2  Altered sleeping 0 1 0 1 1  Tired, decreased energy 0 0 0 1 1  Change in appetite 0 0 0 0 0  Feeling bad or failure about yourself  0 0 0 1 1  Trouble concentrating 0 0 0 0 0  Moving slowly or fidgety/restless 0 0 0 0 0  Suicidal thoughts 0 0 0 0 0  PHQ-9 Score 0 1 0 3 5  Difficult doing work/chores Not difficult at all Not difficult at all   Not difficult at all    Past Medical History:  Past Medical History:  Diagnosis Date   Asthma    in high school    Surgical History:  Past Surgical History:  Procedure Laterality Date   LAPAROSCOPY FOR ECTOPIC PREGNANCY  08/09/2010    Medications:  Current Outpatient Medications on File Prior to Visit  Medication Sig   Multiple Vitamin (MULTIVITAMIN) tablet Take 1 tablet by mouth daily.   No current facility-administered medications on file prior to visit.    Allergies:  No Known Allergies  Social History:  Social History   Socioeconomic History   Marital status: Married    Spouse name: Not on file   Number of children: Not on file   Years of education: Not on file   Highest education level: Not on file  Occupational History   Not on file  Tobacco Use   Smoking status: Never    Smokeless tobacco: Never  Vaping Use   Vaping Use: Never used  Substance and Sexual Activity   Alcohol use: Yes    Comment: 2 a week   Drug use: No   Sexual activity: Yes    Partners: Male    Birth control/protection: None  Other Topics Concern   Not on file  Social History Narrative   Not on file   Social Determinants of Health   Financial Resource Strain: Not on file  Food Insecurity: Not on file  Transportation Needs: Not on file  Physical Activity: Not on file  Stress: Not on file  Social Connections: Not on file  Intimate Partner Violence: Not on file   Social History   Tobacco Use  Smoking Status Never  Smokeless Tobacco Never   Social History   Substance and Sexual Activity  Alcohol Use Yes   Comment: 2 a week    Family History:  Family History  Problem Relation Age of Onset   Huntington's disease Mother    Depression Mother    Hypertension  Father    Huntington's disease Sister    Huntington's disease Maternal Grandfather    Mental illness Paternal Grandmother    Hyperlipidemia Paternal Grandfather    Coronary artery disease Paternal Grandfather    Sudden Cardiac Death Neg Hx     Past medical history, surgical history, medications, allergies, family history and social history reviewed with patient today and changes made to appropriate areas of the chart.   Review of Systems  Constitutional: Negative.   HENT: Negative.    Eyes: Negative.   Respiratory: Negative.    Cardiovascular: Negative.   Gastrointestinal: Negative.   Genitourinary: Negative.   Musculoskeletal: Negative.   Skin: Negative.   Neurological: Negative.   Endo/Heme/Allergies: Negative.   Psychiatric/Behavioral: Negative.     All other ROS negative except what is listed above and in the HPI.      Objective:    BP 105/68   Pulse (!) 52   Temp 97.6 F (36.4 C) (Oral)   Ht 5\' 6"  (1.676 m)   Wt 170 lb 3.2 oz (77.2 kg)   SpO2 99%   BMI 27.47 kg/m   Wt Readings from Last  3 Encounters:  05/20/22 170 lb 3.2 oz (77.2 kg)  01/29/22 168 lb 4.8 oz (76.3 kg)  07/24/21 175 lb (79.4 kg)    Physical Exam Vitals and nursing note reviewed.  Constitutional:      General: She is not in acute distress.    Appearance: Normal appearance. She is normal weight. She is not ill-appearing, toxic-appearing or diaphoretic.  HENT:     Head: Normocephalic and atraumatic.     Right Ear: Tympanic membrane, ear canal and external ear normal. There is no impacted cerumen.     Left Ear: Tympanic membrane, ear canal and external ear normal. There is no impacted cerumen.     Nose: Nose normal. No congestion or rhinorrhea.     Mouth/Throat:     Mouth: Mucous membranes are moist.     Pharynx: Oropharynx is clear. No oropharyngeal exudate or posterior oropharyngeal erythema.  Eyes:     General: No scleral icterus.       Right eye: No discharge.        Left eye: No discharge.     Extraocular Movements: Extraocular movements intact.     Conjunctiva/sclera: Conjunctivae normal.     Pupils: Pupils are equal, round, and reactive to light.  Neck:     Vascular: No carotid bruit.  Cardiovascular:     Rate and Rhythm: Normal rate and regular rhythm.     Pulses: Normal pulses.     Heart sounds: No murmur heard.    No friction rub. No gallop.  Pulmonary:     Effort: Pulmonary effort is normal. No respiratory distress.     Breath sounds: Normal breath sounds. No stridor. No wheezing, rhonchi or rales.  Chest:     Chest wall: No tenderness.  Abdominal:     General: Abdomen is flat. Bowel sounds are normal. There is no distension.     Palpations: Abdomen is soft. There is no mass.     Tenderness: There is no abdominal tenderness. There is no right CVA tenderness, left CVA tenderness, guarding or rebound.     Hernia: No hernia is present.  Genitourinary:    Comments: Breast and pelvic exams deferred with shared decision making Musculoskeletal:        General: No swelling, tenderness,  deformity or signs of injury.     Cervical back:  Normal range of motion and neck supple. No rigidity. No muscular tenderness.     Right lower leg: No edema.     Left lower leg: No edema.  Lymphadenopathy:     Cervical: No cervical adenopathy.  Skin:    General: Skin is warm and dry.     Capillary Refill: Capillary refill takes less than 2 seconds.     Coloration: Skin is not jaundiced or pale.     Findings: No bruising, erythema, lesion or rash.  Neurological:     General: No focal deficit present.     Mental Status: She is alert and oriented to person, place, and time. Mental status is at baseline.     Cranial Nerves: No cranial nerve deficit.     Sensory: No sensory deficit.     Motor: No weakness.     Coordination: Coordination normal.     Gait: Gait normal.     Deep Tendon Reflexes: Reflexes normal.  Psychiatric:        Mood and Affect: Mood normal.        Behavior: Behavior normal.        Thought Content: Thought content normal.        Judgment: Judgment normal.     Results for orders placed or performed in visit on 04/12/21  ECHOCARDIOGRAM COMPLETE  Result Value Ref Range   AR max vel 1.99 cm2   AV Peak grad 12.0 mmHg   Ao pk vel 1.73 m/s   S' Lateral 3.70 cm   Area-P 1/2 3.34 cm2   AV Area VTI 2.15 cm2   AV Mean grad 6.0 mmHg   Single Plane A4C EF 57.2 %   Single Plane A2C EF 57.5 %   Calc EF 57.4 %   AV Area mean vel 2.03 cm2      Assessment & Plan:   Problem List Items Addressed This Visit   None Visit Diagnoses     Routine general medical examination at a health care facility    -  Primary   Vaccines up to date. Screening labs checked today. Pap up to date. Continue diet and exercise. Call with any concerns.   Relevant Orders   CBC with Differential/Platelet   Comprehensive metabolic panel   Lipid Panel w/o Chol/HDL Ratio   Urinalysis, Routine w reflex microscopic   TSH        Follow up plan: Return in about 6 months (around 11/20/2022) for OK  to cancel appt in June.   LABORATORY TESTING:  - Pap smear: up to date  IMMUNIZATIONS:   - Tdap: Tetanus vaccination status reviewed: last tetanus booster within 10 years. - Influenza: Up to date - Pneumovax: Not applicable - Prevnar: Not applicable - COVID: Up to date - HPV: Not applicable   PATIENT COUNSELING:   Advised to take 1 mg of folate supplement per day if capable of pregnancy.   Sexuality: Discussed sexually transmitted diseases, partner selection, use of condoms, avoidance of unintended pregnancy  and contraceptive alternatives.   Advised to avoid cigarette smoking.  I discussed with the patient that most people either abstain from alcohol or drink within safe limits (<=14/week and <=4 drinks/occasion for males, <=7/weeks and <= 3 drinks/occasion for females) and that the risk for alcohol disorders and other health effects rises proportionally with the number of drinks per week and how often a drinker exceeds daily limits.  Discussed cessation/primary prevention of drug use and availability of treatment for abuse.   Diet:  Encouraged to adjust caloric intake to maintain  or achieve ideal body weight, to reduce intake of dietary saturated fat and total fat, to limit sodium intake by avoiding high sodium foods and not adding table salt, and to maintain adequate dietary potassium and calcium preferably from fresh fruits, vegetables, and low-fat dairy products.    stressed the importance of regular exercise  Injury prevention: Discussed safety belts, safety helmets, smoke detector, smoking near bedding or upholstery.   Dental health: Discussed importance of regular tooth brushing, flossing, and dental visits.    NEXT PREVENTATIVE PHYSICAL DUE IN 1 YEAR. Return in about 6 months (around 11/20/2022) for OK to cancel appt in June.

## 2022-05-21 LAB — CBC WITH DIFFERENTIAL/PLATELET
Basophils Absolute: 0 10*3/uL (ref 0.0–0.2)
Basos: 1 %
EOS (ABSOLUTE): 0.1 10*3/uL (ref 0.0–0.4)
Eos: 2 %
Hematocrit: 40.6 % (ref 34.0–46.6)
Hemoglobin: 13.2 g/dL (ref 11.1–15.9)
Immature Grans (Abs): 0 10*3/uL (ref 0.0–0.1)
Immature Granulocytes: 0 %
Lymphocytes Absolute: 1.7 10*3/uL (ref 0.7–3.1)
Lymphs: 24 %
MCH: 29 pg (ref 26.6–33.0)
MCHC: 32.5 g/dL (ref 31.5–35.7)
MCV: 89 fL (ref 79–97)
Monocytes Absolute: 0.5 10*3/uL (ref 0.1–0.9)
Monocytes: 7 %
Neutrophils Absolute: 4.7 10*3/uL (ref 1.4–7.0)
Neutrophils: 66 %
Platelets: 214 10*3/uL (ref 150–450)
RBC: 4.55 x10E6/uL (ref 3.77–5.28)
RDW: 12.5 % (ref 11.7–15.4)
WBC: 7.1 10*3/uL (ref 3.4–10.8)

## 2022-05-21 LAB — COMPREHENSIVE METABOLIC PANEL
ALT: 12 IU/L (ref 0–32)
AST: 15 IU/L (ref 0–40)
Albumin/Globulin Ratio: 2.5 — ABNORMAL HIGH (ref 1.2–2.2)
Albumin: 4.8 g/dL (ref 3.9–4.9)
Alkaline Phosphatase: 44 IU/L (ref 44–121)
BUN/Creatinine Ratio: 18 (ref 9–23)
BUN: 18 mg/dL (ref 6–20)
Bilirubin Total: 0.3 mg/dL (ref 0.0–1.2)
CO2: 24 mmol/L (ref 20–29)
Calcium: 9.3 mg/dL (ref 8.7–10.2)
Chloride: 103 mmol/L (ref 96–106)
Creatinine, Ser: 1 mg/dL (ref 0.57–1.00)
Globulin, Total: 1.9 g/dL (ref 1.5–4.5)
Glucose: 84 mg/dL (ref 70–99)
Potassium: 4.1 mmol/L (ref 3.5–5.2)
Sodium: 141 mmol/L (ref 134–144)
Total Protein: 6.7 g/dL (ref 6.0–8.5)
eGFR: 76 mL/min/{1.73_m2} (ref >59)

## 2022-05-21 LAB — LIPID PANEL W/O CHOL/HDL RATIO
Cholesterol, Total: 166 mg/dL (ref 100–199)
HDL: 70 mg/dL (ref >39)
LDL Chol Calc (NIH): 85 mg/dL (ref 0–99)
Triglycerides: 52 mg/dL (ref 0–149)
VLDL Cholesterol Cal: 11 mg/dL (ref 5–40)

## 2022-05-21 LAB — TSH: TSH: 1.45 u[IU]/mL (ref 0.450–4.500)

## 2022-06-06 ENCOUNTER — Ambulatory Visit: Payer: 59 | Attending: Internal Medicine | Admitting: Internal Medicine

## 2022-06-06 ENCOUNTER — Encounter: Payer: Self-pay | Admitting: Internal Medicine

## 2022-06-06 VITALS — BP 118/82 | HR 65 | Ht 66.0 in | Wt 169.2 lb

## 2022-06-06 DIAGNOSIS — R42 Dizziness and giddiness: Secondary | ICD-10-CM

## 2022-06-06 DIAGNOSIS — R001 Bradycardia, unspecified: Secondary | ICD-10-CM

## 2022-06-06 DIAGNOSIS — I361 Nonrheumatic tricuspid (valve) insufficiency: Secondary | ICD-10-CM

## 2022-06-06 DIAGNOSIS — R202 Paresthesia of skin: Secondary | ICD-10-CM | POA: Diagnosis not present

## 2022-06-06 DIAGNOSIS — I340 Nonrheumatic mitral (valve) insufficiency: Secondary | ICD-10-CM

## 2022-06-06 NOTE — Patient Instructions (Signed)
Medication Instructions:  Your Physician recommend you continue on your current medication as directed.    *If you need a refill on your cardiac medications before your next appointment, please call your pharmacy*   Lab Work: None ordered today   Testing/Procedures: Your physician has requested that you have an echocardiogram in 3 years. Echocardiography is a painless test that uses sound waves to create images of your heart. It provides your doctor with information about the size and shape of your heart and how well your heart's chambers and valves are working.   You may receive an ultrasound enhancing agent through an IV if needed to better visualize your heart during the echo. This procedure takes approximately one hour.  There are no restrictions for this procedure.  This will take place at [address] [address], [place] [address]    Follow-Up: At [facility], you and your health needs are our priority.  As part of our continuing mission to provide you with exceptional heart care, we have created designated Provider Care Teams.  These Care Teams include your primary Cardiologist (physician) and Advanced Practice Providers (APPs -  Physician Assistants and Nurse Practitioners) who all work together to provide you with the care you need, when you need it.  We recommend signing up for the patient portal called "MyChart".  Sign up information is provided on this After Visit Summary.  MyChart is used to connect with patients for Virtual Visits (Telemedicine).  Patients are able to view lab/test results, encounter notes, upcoming appointments, etc.  Non-urgent messages can be sent to your provider as well.   To learn more about what you can do with MyChart, go to [email or web address].    Your next appointment:   3 year(s)  Provider:   You may see Yvonne Kendall, MD or one of the following Advanced Practice Providers on your designated Care Team:    Nicolasa Ducking, NP Eula Listen, PA-C Cadence Fransico Michael, PA-C Charlsie Quest, NP

## 2022-06-06 NOTE — Progress Notes (Unsigned)
Follow-up Outpatient Visit Date: 06/06/2022  Primary Care Provider: Jakelin, [place], DO [address] [place] [address]  Chief Complaint: Follow-up bradycardia and mitral/tricuspid regurgitation  HPI:  [name] is a 32 y.o. female with history of childhood asthma, who presents for follow-up of bradycardia.  I met her in 03/2021 for evaluation of bradycardia.  She reported resting heart rate in the 50s noted on her smart watch.  She was concerned that this was playing a role in her fatigue and inability to lose weight.  She also endorsed intermittent "pinching" in her chest as well as lightheadedness when exercising.  Subsequent echo was normal other than mild mitral and tricuspid regurgitation that was not felt to be contributing to her symptoms.  Today, Ms. Brinkman reports that she is feeling better.  She reports that she was started on appropriate on about a year ago.  Resting heart rates are still in the 50s, though she notes that her arms are not falling asleep as often.  She continues to exercise regularly without difficulty, though she sometimes gets lightheaded after exercising, particularly when doing pull-ups or hanging by her arms.  She has not had any chest pain, shortness of breath, palpitations, or edema.  --------------------------------------------------------------------------------------------------  Cardiovascular History & Procedures: Cardiovascular Problems: Sinus bradycardia Chest pain Lightheadedness/near syncope   Risk Factors: Hyperlipidemia   Cath/PCI: None   CV Surgery: None   EP Procedures and Devices: None   Non-Invasive Evaluation(s): TTE (04/12/2021): Normal LV size and wall thickness.  LVEF 60-65% with normal wall motion and diastolic function.  Normal RV size and function.  Normal PA pressure.  Normal biatrial size.  Mild mitral tricuspid regurgitation.  CVP ~8 mmHg.  Recent CV Pertinent Labs: Lab Results  Component Value Date   CHOL 166  05/20/2022   HDL 70 05/20/2022   LDLCALC 85 05/20/2022   TRIG 52 05/20/2022   K 4.1 05/20/2022   BUN 18 05/20/2022   CREATININE 1.00 05/20/2022    Past medical and surgical history were reviewed and updated in EPIC.  Current Meds  Medication Sig   buPROPion (WELLBUTRIN SR) 150 MG 12 hr tablet Take 1 tablet (150 mg total) by mouth 2 (two) times daily.   Multiple Vitamin (MULTIVITAMIN) tablet Take 1 tablet by mouth daily.    Allergies: Patient has no known allergies.  Social History   Tobacco Use   Smoking status: Never   Smokeless tobacco: Never  Vaping Use   Vaping Use: Never used  Substance Use Topics   Alcohol use: Yes    Comment: 2 a week   Drug use: No    Family History  Problem Relation Age of Onset   Huntington's disease Mother    Depression Mother    Hypertension Father    Huntington's disease Sister    Huntington's disease Maternal Grandfather    Mental illness Paternal Grandmother    Hyperlipidemia Paternal Grandfather    Coronary artery disease Paternal Grandfather    Sudden Cardiac Death Neg Hx     Review of Systems: A 12-system review of systems was performed and was negative except as noted in the HPI.  --------------------------------------------------------------------------------------------------  Physical Exam: BP 118/82 (BP Location: Left Arm, Patient Position: Sitting, Cuff Size: Normal)   Pulse 65   Ht  (1.676 m)   Wt 169 lb 4 oz (76.8 kg)   SpO2 99%   BMI 27.32 kg/m   General:  NAD. Neck: No JVD or HJR. Lungs: Clear to  auscultation bilaterally without wheezes or crackles. Heart: Regular rate and rhythm without murmurs, rubs, or gallops. Abdomen: Soft, nontender, nondistended. Extremities: No lower extremity edema.  EKG: Normal sinus rhythm with sinus arrhythmia.  No significant abnormalities.  No change since 03/28/2021.  Lab Results  Component Value Date   WBC 7.1 05/20/2022   HGB 13.2 05/20/2022   HCT 40.6 05/20/2022    MCV 89 05/20/2022   PLT 214 05/20/2022    Lab Results  Component Value Date   NA 141 05/20/2022   K 4.1 05/20/2022   CL 103 05/20/2022   CO2 24 05/20/2022   BUN 18 05/20/2022   CREATININE 1.00 05/20/2022   GLUCOSE 84 05/20/2022   ALT 12 05/20/2022    Lab Results  Component Value Date   CHOL 166 05/20/2022   HDL 70 05/20/2022   LDLCALC 85 05/20/2022   TRIG 52 05/20/2022    --------------------------------------------------------------------------------------------------  ASSESSMENT AND PLAN: 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  No symptoms reported today to suggest heart failure with worsening valve disease.  We have agreed to repeat an echocardiogram in about 3 years to ensure stability.  Lightheadedness and paresthesias: Arm numbness and intermittent lightheadedness have improved, but still seen to be related to certain positioning.  No further workup recommended at this time.  Bradycardia: EKG today shows normal sinus rhythm with sinus arrhythmia, normal finding and otherwise healthy young woman.  I counseled Ms. Maring that her resting heart rate in the 50s is not abnormal in someone who is good cardiovascular conditioning.  I encouraged her to continue exercising and to let us know if any new symptoms develop.  Follow-up: Return to clinic in 3 years, shortly after follow-up echocardiogram.  Yvonne Kendall, MD 06/06/2022 4:43 PM

## 2022-06-08 ENCOUNTER — Encounter: Payer: Self-pay | Admitting: Internal Medicine

## 2022-06-08 DIAGNOSIS — I340 Nonrheumatic mitral (valve) insufficiency: Secondary | ICD-10-CM | POA: Insufficient documentation

## 2022-06-08 DIAGNOSIS — I361 Nonrheumatic tricuspid (valve) insufficiency: Secondary | ICD-10-CM | POA: Insufficient documentation

## 2022-06-08 DIAGNOSIS — R42 Dizziness and giddiness: Secondary | ICD-10-CM | POA: Insufficient documentation

## 2022-06-12 ENCOUNTER — Ambulatory Visit: Payer: 59 | Admitting: Nurse Practitioner

## 2022-07-31 ENCOUNTER — Encounter: Payer: 59 | Admitting: Family Medicine

## 2022-08-04 ENCOUNTER — Encounter: Payer: 59 | Admitting: Family Medicine

## 2022-09-05 ENCOUNTER — Encounter: Payer: Self-pay | Admitting: Family Medicine

## 2022-09-07 ENCOUNTER — Other Ambulatory Visit: Payer: Self-pay | Admitting: Family Medicine

## 2022-09-07 MED ORDER — BUPROPION HCL ER (SR) 200 MG PO TB12: 200.0000 mg | ORAL_TABLET | Freq: Two times a day (BID) | ORAL | 0 refills | Status: DC

## 2022-10-08 ENCOUNTER — Other Ambulatory Visit: Payer: Self-pay | Admitting: Family Medicine

## 2022-10-09 NOTE — Telephone Encounter (Signed)
Unable to refill per protocol, Rx expired. Discontinued 05/20/22, dose change.  Requested Prescriptions  Pending Prescriptions Disp Refills   buPROPion (WELLBUTRIN SR) 150 MG 12 hr tablet [Pharmacy Med Name: BUPROPION SR 150MG  TABLETS (12 H)] 180 tablet 1    Sig: TAKE 1 TABLET(150 MG) BY MOUTH TWICE DAILY     Psychiatry: Antidepressants - bupropion Passed - 10/08/2022  9:24 AM      Passed - Cr in normal range and within 360 days    Creatinine, Ser  Date Value Ref Range Status  05/20/2022 1.00 0.57 - 1.00 mg/dL Final         Passed - AST in normal range and within 360 days    AST  Date Value Ref Range Status  05/20/2022 15 0 - 40 IU/L Final         Passed - ALT in normal range and within 360 days    ALT  Date Value Ref Range Status  05/20/2022 12 0 - 32 IU/L Final         Passed - Last BP in normal range    BP Readings from Last 1 Encounters:  06/06/22 118/82         Passed - Valid encounter within last 6 months    Recent Outpatient Visits           4 months ago Routine general medical examination at a health care facility   [facility], [place] P, DO   8 months ago Family history of Huntington's chorea   [place] [facility] [place], [place], DO   1 year ago Overweight   [place] [facility] [place], [place], DO   1 year ago Overweight   [place] [facility] [place], [place] P, DO   1 year ago Routine general medical examination at a health care facility   [facility] Dorcas Carrow, DO       Future Appointments             In 1 month Laural Benes, Oralia Rud, DO [place] [facility]

## 2022-11-20 ENCOUNTER — Encounter: Payer: Self-pay | Admitting: Family Medicine

## 2022-11-20 ENCOUNTER — Ambulatory Visit: Payer: 59 | Admitting: Family Medicine

## 2022-11-20 VITALS — BP 125/77 | HR 62 | Temp 98.2°F | Ht 66.0 in | Wt 172.2 lb

## 2022-11-20 DIAGNOSIS — Z23 Encounter for immunization: Secondary | ICD-10-CM

## 2022-11-20 DIAGNOSIS — R5382 Chronic fatigue, unspecified: Secondary | ICD-10-CM | POA: Diagnosis not present

## 2022-11-20 MED ORDER — BUPROPION HCL ER (SR) 200 MG PO TB12: 200.0000 mg | ORAL_TABLET | Freq: Two times a day (BID) | ORAL | 1 refills | Status: DC

## 2022-11-20 NOTE — Progress Notes (Signed)
BP 125/77   Pulse 62   Temp 98.2 F (36.8 C) (Oral)   Ht 5\' 6"  (1.676 m)   Wt 172 lb 3.2 oz (78.1 kg)   LMP 11/14/2022 (Approximate)   SpO2 100%   BMI 27.79 kg/m    Subjective:    Patient ID: [name], female    DOB: [birth year], 32 y.o.   MRN: [id number]  HPI: [name] is a 32 y.o. female  Chief Complaint  Patient presents with   Medication Management   FATIGUE- doing great on the wellbutrin. Able to exercise 5x a week. Feeling well. No concerns.  Duration:  chronic Severity: mild  Onset: gradual Context when symptoms started:  unknown Symptoms improve with rest: yes  Depressive symptoms: no Stress/anxiety: no Insomnia: no  Snoring: no Observed apnea by bed partner: no Daytime hypersomnolence:no Wakes feeling refreshed: yes History of sleep study: no Dysnea on exertion:  no Orthopnea/PND: no Chest pain: no Chronic cough: no Lower extremity edema: no Arthralgias:no Myalgias: no Weakness: no Rash: no   Relevant past medical, surgical, family and social history reviewed and updated as indicated. Interim medical history since our last visit reviewed. Allergies and medications reviewed and updated.  Review of Systems  Constitutional: Negative.   Respiratory: Negative.    Cardiovascular: Negative.   Gastrointestinal: Negative.   Musculoskeletal: Negative.   Psychiatric/Behavioral: Negative.      Per HPI unless specifically indicated above     Objective:    BP 125/77   Pulse 62   Temp 98.2 F (36.8 C) (Oral)   Ht 5\' 6"  (1.676 m)   Wt 172 lb 3.2 oz (78.1 kg)   LMP 11/14/2022 (Approximate)   SpO2 100%   BMI 27.79 kg/m   Wt Readings from Last 3 Encounters:  11/20/22 172 lb 3.2 oz (78.1 kg)  06/06/22 169 lb 4 oz (76.8 kg)  05/20/22 170 lb 3.2 oz (77.2 kg)    Physical Exam Vitals and nursing note reviewed.  Constitutional:      General: She is not in acute distress.    Appearance: Normal appearance. She is not ill-appearing,  toxic-appearing or diaphoretic.  HENT:     Head: Normocephalic and atraumatic.     Right Ear: External ear normal.     Left Ear: External ear normal.     Nose: Nose normal.     Mouth/Throat:     Mouth: Mucous membranes are moist.     Pharynx: Oropharynx is clear.  Eyes:     General: No scleral icterus.       Right eye: No discharge.        Left eye: No discharge.     Extraocular Movements: Extraocular movements intact.     Conjunctiva/sclera: Conjunctivae normal.     Pupils: Pupils are equal, round, and reactive to light.  Cardiovascular:     Rate and Rhythm: Normal rate and regular rhythm.     Pulses: Normal pulses.     Heart sounds: Normal heart sounds. No murmur heard.    No friction rub. No gallop.  Pulmonary:     Effort: Pulmonary effort is normal. No respiratory distress.     Breath sounds: Normal breath sounds. No stridor. No wheezing, rhonchi or rales.  Chest:     Chest wall: No tenderness.  Musculoskeletal:        General: Normal range of motion.     Cervical back: Normal range of motion and neck supple.  Skin:  General: Skin is warm and dry.     Capillary Refill: Capillary refill takes less than 2 seconds.     Coloration: Skin is not jaundiced or pale.     Findings: No bruising, erythema, lesion or rash.  Neurological:     General: No focal deficit present.     Mental Status: She is alert and oriented to person, place, and time. Mental status is at baseline.  Psychiatric:        Mood and Affect: Mood normal.        Behavior: Behavior normal.        Thought Content: Thought content normal.        Judgment: Judgment normal.     Results for orders placed or performed in visit on 05/20/22  Microscopic Examination   Urine  Result Value Ref Range   WBC, UA None seen 0 - 5 /hpf   RBC, Urine 0-2 0 - 2 /hpf   Epithelial Cells (non renal) 0-10 0 - 10 /hpf   Bacteria, UA None seen None seen/Few  CBC with Differential/Platelet  Result Value Ref Range   WBC 7.1  3.4 - 10.8 x10E3/uL   RBC 4.55 3.77 - 5.28 x10E6/uL   Hemoglobin 13.2 11.1 - 15.9 g/dL   Hematocrit 16.1 09.6 - 46.6 %   MCV 89 79 - 97 fL   MCH 29.0 26.6 - 33.0 pg   MCHC 32.5 31.5 - 35.7 g/dL   RDW 04.5 40.9 - 81.1 %   Platelets 214 150 - 450 x10E3/uL   Neutrophils 66 Not Estab. %   Lymphs 24 Not Estab. %   Monocytes 7 Not Estab. %   Eos 2 Not Estab. %   Basos 1 Not Estab. %   Neutrophils Absolute 4.7 1.4 - 7.0 x10E3/uL   Lymphocytes Absolute 1.7 0.7 - 3.1 x10E3/uL   Monocytes Absolute 0.5 0.1 - 0.9 x10E3/uL   EOS (ABSOLUTE) 0.1 0.0 - 0.4 x10E3/uL   Basophils Absolute 0.0 0.0 - 0.2 x10E3/uL   Immature Granulocytes 0 Not Estab. %   Immature Grans (Abs) 0.0 0.0 - 0.1 x10E3/uL  Comprehensive metabolic panel  Result Value Ref Range   Glucose 84 70 - 99 mg/dL   BUN 18 6 - 20 mg/dL   Creatinine, Ser 9.14 0.57 - 1.00 mg/dL   eGFR 76 >78 GN/FAO/1.30   BUN/Creatinine Ratio 18 9 - 23   Sodium 141 134 - 144 mmol/L   Potassium 4.1 3.5 - 5.2 mmol/L   Chloride 103 96 - 106 mmol/L   CO2 24 20 - 29 mmol/L   Calcium 9.3 8.7 - 10.2 mg/dL   Total Protein 6.7 6.0 - 8.5 g/dL   Albumin 4.8 3.9 - 4.9 g/dL   Globulin, Total 1.9 1.5 - 4.5 g/dL   Albumin/Globulin Ratio 2.5 (H) 1.2 - 2.2   Bilirubin Total 0.3 0.0 - 1.2 mg/dL   Alkaline Phosphatase 44 44 - 121 IU/L   AST 15 0 - 40 IU/L   ALT 12 0 - 32 IU/L  Lipid Panel w/o Chol/HDL Ratio  Result Value Ref Range   Cholesterol, Total 166 100 - 199 mg/dL   Triglycerides 52 0 - 149 mg/dL   HDL 70 >86 mg/dL   VLDL Cholesterol Cal 11 5 - 40 mg/dL   LDL Chol Calc (NIH) 85 0 - 99 mg/dL  Urinalysis, Routine w reflex microscopic  Result Value Ref Range   Specific Gravity, UA >1.030 (H) 1.005 - 1.030   pH, UA 5.5  5.0 - 7.5   Color, UA Yellow Yellow   Appearance Ur Clear Clear   Leukocytes,UA Negative Negative   Protein,UA Negative Negative/Trace   Glucose, UA Negative Negative   Ketones, UA Negative Negative   RBC, UA Trace (A) Negative    Bilirubin, UA Negative Negative   Urobilinogen, Ur 0.2 0.2 - 1.0 mg/dL   Nitrite, UA Negative Negative   Microscopic Examination See below:   TSH  Result Value Ref Range   TSH 1.450 0.450 - 4.500 uIU/mL      Assessment & Plan:   Problem List Items Addressed This Visit   None Visit Diagnoses     Need for influenza vaccination    -  Primary   Relevant Orders   Flu vaccine trivalent PF, 6mos and older(Flulaval,Afluria,Fluarix,Fluzone)        Follow up plan: No follow-ups on file.

## 2022-11-20 NOTE — Assessment & Plan Note (Signed)
Continues to do great on the wellbutrin. Continue current regimen. Continue to monitor. Refills given today.

## 2023-03-25 NOTE — Therapy (Incomplete)
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  Rationale for Evaluation and Treatment: Rehabilitation  ONSET DATE: ***  SUBJECTIVE:   SUBJECTIVE STATEMENT: ***  PERTINENT HISTORY: *** PAIN:  Are you having pain? {OPRCPAIN:27236}  PRECAUTIONS: {Therapy precautions:24002}  RED FLAGS: {PT Red Flags:29287}   WEIGHT BEARING RESTRICTIONS: {Yes ***/No:24003}  FALLS:  Has patient fallen in last 6 months? {fallsyesno:27318}  LIVING ENVIRONMENT: Lives with: {OPRC lives with:25569::"lives with their family"} Lives in: {Lives in:25570} Stairs: {opstairs:27293} Has following equipment at home: {Assistive devices:23999}  OCCUPATION: ***  PLOF: {PLOF:24004}  PATIENT GOALS: ***  NEXT MD VISIT: ***  OBJECTIVE:  Note: Objective measures were completed at Evaluation unless otherwise noted.  DIAGNOSTIC FINDINGS: ***  PATIENT SURVEYS:  {rehab surveys:24030}  COGNITION: Overall cognitive status: {cognition:24006}     SENSATION: {sensation:27233}  EDEMA:  {edema:24020}  MUSCLE  LENGTH: Hamstrings: Right *** deg; Left *** deg Maisie Fus test: Right *** deg; Left *** deg  POSTURE: {posture:25561}  PALPATION: ***  LOWER EXTREMITY ROM:  {AROM/PROM:27142} ROM Right eval Left eval  Hip flexion    Hip extension    Hip abduction    Hip adduction    Hip internal rotation    Hip external rotation    Knee flexion    Knee extension    Ankle dorsiflexion    Ankle plantarflexion    Ankle inversion    Ankle eversion     (Blank rows = not tested)  LOWER EXTREMITY MMT:  MMT Right eval Left eval  Hip flexion    Hip extension    Hip abduction    Hip adduction    Hip internal rotation    Hip external rotation    Knee flexion    Knee extension    Ankle dorsiflexion    Ankle plantarflexion    Ankle inversion    Ankle eversion     (Blank rows = not tested)  LOWER EXTREMITY SPECIAL TESTS:  {LEspecialtests:26242}  FUNCTIONAL TESTS:  {Functional tests:24029}  GAIT: Distance walked: *** Assistive device utilized: {Assistive devices:23999} Level of assistance: {Levels of assistance:24026} Comments: ***  TREATMENT DATE: 03/25/23    PATIENT EDUCATION:  Education details: *** Person educated: {Person educated:25204} Education method: {Education Method:25205} Education comprehension: {Education Comprehension:25206}  HOME EXERCISE PROGRAM: ***  ASSESSMENT:  CLINICAL IMPRESSION: Patient is a 33 y.o. female who was seen today for physical therapy evaluation and treatment for ***.   OBJECTIVE IMPAIRMENTS: {opptimpairments:25111}.   ACTIVITY LIMITATIONS: {activitylimitations:27494}  PARTICIPATION LIMITATIONS: {participationrestrictions:25113}  PERSONAL FACTORS: {Personal factors:25162} are also affecting patient's functional outcome.   REHAB POTENTIAL: {rehabpotential:25112}  CLINICAL DECISION MAKING: {clinical decision  making:25114}  EVALUATION COMPLEXITY: {Evaluation complexity:25115}   GOALS: Goals reviewed with patient? Yes  SHORT TERM GOALS: Target date: *** *** Baseline: Goal status: INITIAL  2.  *** Baseline:  Goal status: INITIAL  3.  *** Baseline:  Goal status: INITIAL  4.  *** Baseline:  Goal status: INITIAL  5.  *** Baseline:  Goal status: INITIAL  6.  *** Baseline:  Goal status: INITIAL  LONG TERM GOALS: Target date: ***  *** Baseline:  Goal status: INITIAL  2.  *** Baseline:  Goal status: INITIAL  3.  *** Baseline:  Goal status: INITIAL  4.  *** Baseline:  Goal status: INITIAL  5.  *** Baseline:  Goal status: INITIAL  6.  *** Baseline:  Goal status: INITIAL   PLAN:  PT FREQUENCY: 1-2x/week  PT DURATION: 6 weeks  PLANNED INTERVENTIONS: {rehab planned interventions:25118::"97110-Therapeutic exercises","97530- Therapeutic [phone]- Neuromuscular re-education","97535- Self JXBJ","47829- Manual therapy"}  PLAN FOR NEXT SESSION: ***   Maylon Peppers, PT, DPT Physical Therapist - [place]  [facility]  03/25/2023, 11:52 AM

## 2023-03-26 ENCOUNTER — Ambulatory Visit: Payer: 59 | Admitting: Physical Therapy

## 2023-03-30 ENCOUNTER — Encounter: Payer: 59 | Admitting: Physical Therapy

## 2023-04-07 ENCOUNTER — Encounter: Payer: Self-pay | Admitting: Family Medicine

## 2023-04-07 ENCOUNTER — Encounter: Payer: 59 | Admitting: Physical Therapy

## 2023-04-09 ENCOUNTER — Encounter: Payer: 59 | Admitting: Physical Therapy

## 2023-04-14 ENCOUNTER — Encounter: Payer: 59 | Admitting: Physical Therapy

## 2023-04-16 ENCOUNTER — Encounter: Payer: 59 | Admitting: Physical Therapy

## 2023-04-21 ENCOUNTER — Encounter: Payer: 59 | Admitting: Physical Therapy

## 2023-04-23 ENCOUNTER — Encounter: Payer: 59 | Admitting: Physical Therapy

## 2023-04-28 ENCOUNTER — Encounter: Payer: 59 | Admitting: Physical Therapy

## 2023-04-29 ENCOUNTER — Telehealth: Payer: Self-pay | Admitting: Family Medicine

## 2023-04-29 NOTE — Telephone Encounter (Signed)
 Patient dropped off document  Physical Examination for Adoptive Applicants , to be filled out by provider. Patient requested to send it back via Call Patient to pick up within 5-days. Document is located in providers folder. Please advise at Mobile [phone] (mobile)

## 2023-04-30 ENCOUNTER — Encounter: Payer: 59 | Admitting: Physical Therapy

## 2023-05-04 NOTE — Telephone Encounter (Signed)
 Patient not seen since 10/2022 however this paperwork must be on file to remain eligble for adoption. Scheduled patient fot 05/11/2023 however advised if provider can complete without visit I will let her know and cancel that appointment.

## 2023-05-04 NOTE — Telephone Encounter (Signed)
 Form requires blood work- will fill out at her appointment next week

## 2023-05-05 ENCOUNTER — Encounter: Payer: 59 | Admitting: Physical Therapy

## 2023-05-07 ENCOUNTER — Encounter: Payer: 59 | Admitting: Physical Therapy

## 2023-05-11 ENCOUNTER — Ambulatory Visit: Admitting: Family Medicine

## 2023-05-12 ENCOUNTER — Encounter: Payer: 59 | Admitting: Physical Therapy

## 2023-05-14 ENCOUNTER — Ambulatory Visit: Admitting: Family Medicine

## 2023-05-14 ENCOUNTER — Encounter: Payer: Self-pay | Admitting: Family Medicine

## 2023-05-14 VITALS — BP 108/78 | HR 89 | Temp 98.1°F | Resp 17 | Wt 171.0 lb

## 2023-05-14 DIAGNOSIS — Z0289 Encounter for other administrative examinations: Secondary | ICD-10-CM | POA: Diagnosis not present

## 2023-05-14 DIAGNOSIS — R5382 Chronic fatigue, unspecified: Secondary | ICD-10-CM

## 2023-05-14 LAB — URINALYSIS, ROUTINE W REFLEX MICROSCOPIC
Bilirubin, UA: NEGATIVE
Glucose, UA: NEGATIVE
Ketones, UA: NEGATIVE
Leukocytes,UA: NEGATIVE
Nitrite, UA: NEGATIVE
Protein,UA: NEGATIVE
Specific Gravity, UA: 1.01 (ref 1.005–1.030)
Urobilinogen, Ur: 0.2 mg/dL (ref 0.2–1.0)
pH, UA: 7 (ref 5.0–7.5)

## 2023-05-14 LAB — MICROSCOPIC EXAMINATION

## 2023-05-14 MED ORDER — BUPROPION HCL ER (XL) 300 MG PO TB24: 300.0000 mg | ORAL_TABLET | Freq: Every day | ORAL | 3 refills | Status: DC

## 2023-05-14 NOTE — Progress Notes (Unsigned)
 There were no vitals taken for this visit.   Subjective:    Patient ID: [name], female    DOB: 12-03-1990, 33 y.o.   MRN: [id number]  HPI: [name] is a 33 y.o. female  Chief Complaint  Patient presents with   chronic fatigue   FATIGUE Duration:  chronic Severity: mild  Onset: gradual Context when symptoms started:  illness, weight gain Symptoms improve with rest: no  Depressive symptoms: no Stress/anxiety: yes Insomnia: no  Snoring: no Observed apnea by bed partner: no Daytime hypersomnolence:no Wakes feeling refreshed: yes History of sleep study: no Dysnea on exertion:  no Orthopnea/PND: no Chest pain: no Chronic cough: no Lower extremity edema: no Arthralgias:no Myalgias: no Weakness: no Rash: no  Relevant past medical, surgical, family and social history reviewed and updated as indicated. Interim medical history since our last visit reviewed. Allergies and medications reviewed and updated.  Review of Systems  Constitutional: Negative.   Respiratory: Negative.    Cardiovascular: Negative.   Musculoskeletal: Negative.   Neurological: Negative.   Psychiatric/Behavioral: Negative.      Per HPI unless specifically indicated above     Objective:    There were no vitals taken for this visit.  Wt Readings from Last 3 Encounters:  11/20/22 172 lb 3.2 oz (78.1 kg)  06/06/22 169 lb 4 oz (76.8 kg)  05/20/22 170 lb 3.2 oz (77.2 kg)    Physical Exam Vitals and nursing note reviewed.  Constitutional:      General: She is not in acute distress.    Appearance: Normal appearance. She is not ill-appearing, toxic-appearing or diaphoretic.  HENT:     Head: Normocephalic and atraumatic.     Right Ear: External ear normal.     Left Ear: External ear normal.     Nose: Nose normal.     Mouth/Throat:     Mouth: Mucous membranes are moist.     Pharynx: Oropharynx is clear.  Eyes:     General: No scleral icterus.       Right eye: No discharge.         Left eye: No discharge.     Extraocular Movements: Extraocular movements intact.     Conjunctiva/sclera: Conjunctivae normal.     Pupils: Pupils are equal, round, and reactive to light.  Cardiovascular:     Rate and Rhythm: Normal rate and regular rhythm.     Pulses: Normal pulses.     Heart sounds: Normal heart sounds. No murmur heard.    No friction rub. No gallop.  Pulmonary:     Effort: Pulmonary effort is normal. No respiratory distress.     Breath sounds: Normal breath sounds. No stridor. No wheezing, rhonchi or rales.  Chest:     Chest wall: No tenderness.  Musculoskeletal:        General: Normal range of motion.     Cervical back: Normal range of motion and neck supple.  Skin:    General: Skin is warm and dry.     Capillary Refill: Capillary refill takes less than 2 seconds.     Coloration: Skin is not jaundiced or pale.     Findings: No bruising, erythema, lesion or rash.  Neurological:     General: No focal deficit present.     Mental Status: She is alert and oriented to person, place, and time. Mental status is at baseline.  Psychiatric:        Mood and Affect: Mood normal.  Behavior: Behavior normal.        Thought Content: Thought content normal.        Judgment: Judgment normal.     Results for orders placed or performed in visit on 05/14/23  Microscopic Examination   Collection Time: 05/14/23  1:26 PM   Urine  Result Value Ref Range   RBC, Urine 0-2 0 - 2 /hpf   Epithelial Cells (non renal) 0-10 0 - 10 /hpf  Urinalysis, Routine w reflex microscopic   Collection Time: 05/14/23  1:26 PM  Result Value Ref Range   Specific Gravity, UA 1.010 1.005 - 1.030   pH, UA 7.0 5.0 - 7.5   Color, UA Yellow Yellow   Appearance Ur Clear Clear   Leukocytes,UA Negative Negative   Protein,UA Negative Negative/Trace   Glucose, UA Negative Negative   Ketones, UA Negative Negative   RBC, UA Trace (A) Negative   Bilirubin, UA Negative Negative   Urobilinogen, Ur  0.2 0.2 - 1.0 mg/dL   Nitrite, UA Negative Negative   Microscopic Examination See below:   CBC with Differential/Platelet   Collection Time: 05/14/23  1:27 PM  Result Value Ref Range   WBC 5.1 3.4 - 10.8 x10E3/uL   RBC 4.66 3.77 - 5.28 x10E6/uL   Hemoglobin 14.1 11.1 - 15.9 g/dL   Hematocrit 69.6 29.5 - 46.6 %   MCV 90 79 - 97 fL   MCH 30.3 26.6 - 33.0 pg   MCHC 33.7 31.5 - 35.7 g/dL   RDW 28.4 13.2 - 44.0 %   Platelets 237 150 - 450 x10E3/uL   Neutrophils 53 Not Estab. %   Lymphs 36 Not Estab. %   Monocytes 8 Not Estab. %   Eos 2 Not Estab. %   Basos 1 Not Estab. %   Neutrophils Absolute 2.7 1.4 - 7.0 x10E3/uL   Lymphocytes Absolute 1.8 0.7 - 3.1 x10E3/uL   Monocytes Absolute 0.4 0.1 - 0.9 x10E3/uL   EOS (ABSOLUTE) 0.1 0.0 - 0.4 x10E3/uL   Basophils Absolute 0.0 0.0 - 0.2 x10E3/uL   Immature Granulocytes 0 Not Estab. %   Immature Grans (Abs) 0.0 0.0 - 0.1 x10E3/uL  Lipid Panel w/o Chol/HDL Ratio   Collection Time: 05/14/23  1:27 PM  Result Value Ref Range   Cholesterol, Total 154 100 - 199 mg/dL   Triglycerides 48 0 - 149 mg/dL   HDL 65 >10 mg/dL   VLDL Cholesterol Cal 10 5 - 40 mg/dL   LDL Chol Calc (NIH) 79 0 - 99 mg/dL  TSH   Collection Time: 05/14/23  1:27 PM  Result Value Ref Range   TSH 0.984 0.450 - 4.500 uIU/mL  Comprehensive metabolic panel   Collection Time: 05/14/23  1:27 PM  Result Value Ref Range   Glucose 93 70 - 99 mg/dL   BUN 12 6 - 20 mg/dL   Creatinine, Ser 2.72 0.57 - 1.00 mg/dL   eGFR 84 >53 GU/YQI/3.47   BUN/Creatinine Ratio 13 9 - 23   Sodium 142 134 - 144 mmol/L   Potassium 3.8 3.5 - 5.2 mmol/L   Chloride 102 96 - 106 mmol/L   CO2 26 20 - 29 mmol/L   Calcium 9.2 8.7 - 10.2 mg/dL   Total Protein 6.9 6.0 - 8.5 g/dL   Albumin 4.7 3.9 - 4.9 g/dL   Globulin, Total 2.2 1.5 - 4.5 g/dL   Bilirubin Total 0.4 0.0 - 1.2 mg/dL   Alkaline Phosphatase 41 (L) 44 - 121 IU/L  AST 15 0 - 40 IU/L   ALT 10 0 - 32 IU/L  QuantiFERON-TB Gold Plus    Collection Time: 05/14/23  2:43 PM  Result Value Ref Range   QuantiFERON Incubation WILL FOLLOW    QuantiFERON Criteria WILL FOLLOW    QuantiFERON TB1 Ag Value WILL FOLLOW    QuantiFERON TB2 Ag Value WILL FOLLOW    QuantiFERON Nil Value WILL FOLLOW    QuantiFERON Mitogen Value WILL FOLLOW    QuantiFERON-TB Gold Plus WILL FOLLOW       Assessment & Plan:   Problem List Items Addressed This Visit       Other   Fatigue - Primary   Acting up a bit more with recent ankle injury. Will increase her wellbutrin to 300mg  daily and recheck in 6 weeks. Call with any concerns.       Relevant Orders   CBC with Differential/Platelet (Completed)   TSH (Completed)   Comprehensive metabolic panel (Completed)   Other Visit Diagnoses       Encounter for physical examination of prospective foster parent       Labs drawn and paperwork filled out. Call with any concerns.   Relevant Orders   Urinalysis, Routine w reflex microscopic (Completed)   Lipid Panel w/o Chol/HDL Ratio (Completed)   QuantiFERON-TB Gold Plus (Completed)        Follow up plan: Return in about 6 weeks (around 06/25/2023) for physical, OK to cancel appt 05/25/23.

## 2023-05-14 NOTE — Assessment & Plan Note (Signed)
 Acting up a bit more with recent ankle injury. Will increase her wellbutrin to 300mg  daily and recheck in 6 weeks. Call with any concerns.

## 2023-05-15 ENCOUNTER — Encounter: Payer: Self-pay | Admitting: Family Medicine

## 2023-05-15 LAB — LIPID PANEL W/O CHOL/HDL RATIO
Cholesterol, Total: 154 mg/dL (ref 100–199)
HDL: 65 mg/dL (ref >39)
LDL Chol Calc (NIH): 79 mg/dL (ref 0–99)
Triglycerides: 48 mg/dL (ref 0–149)
VLDL Cholesterol Cal: 10 mg/dL (ref 5–40)

## 2023-05-15 LAB — COMPREHENSIVE METABOLIC PANEL
ALT: 10 IU/L (ref 0–32)
AST: 15 IU/L (ref 0–40)
Albumin: 4.7 g/dL (ref 3.9–4.9)
Alkaline Phosphatase: 41 IU/L — ABNORMAL LOW (ref 44–121)
BUN/Creatinine Ratio: 13 (ref 9–23)
BUN: 12 mg/dL (ref 6–20)
Bilirubin Total: 0.4 mg/dL (ref 0.0–1.2)
CO2: 26 mmol/L (ref 20–29)
Calcium: 9.2 mg/dL (ref 8.7–10.2)
Chloride: 102 mmol/L (ref 96–106)
Creatinine, Ser: 0.92 mg/dL (ref 0.57–1.00)
Globulin, Total: 2.2 g/dL (ref 1.5–4.5)
Glucose: 93 mg/dL (ref 70–99)
Potassium: 3.8 mmol/L (ref 3.5–5.2)
Sodium: 142 mmol/L (ref 134–144)
Total Protein: 6.9 g/dL (ref 6.0–8.5)
eGFR: 84 mL/min/{1.73_m2} (ref >59)

## 2023-05-15 LAB — CBC WITH DIFFERENTIAL/PLATELET
Basophils Absolute: 0 10*3/uL (ref 0.0–0.2)
Basos: 1 %
EOS (ABSOLUTE): 0.1 10*3/uL (ref 0.0–0.4)
Eos: 2 %
Hematocrit: 41.9 % (ref 34.0–46.6)
Hemoglobin: 14.1 g/dL (ref 11.1–15.9)
Immature Grans (Abs): 0 10*3/uL (ref 0.0–0.1)
Immature Granulocytes: 0 %
Lymphocytes Absolute: 1.8 10*3/uL (ref 0.7–3.1)
Lymphs: 36 %
MCH: 30.3 pg (ref 26.6–33.0)
MCHC: 33.7 g/dL (ref 31.5–35.7)
MCV: 90 fL (ref 79–97)
Monocytes Absolute: 0.4 10*3/uL (ref 0.1–0.9)
Monocytes: 8 %
Neutrophils Absolute: 2.7 10*3/uL (ref 1.4–7.0)
Neutrophils: 53 %
Platelets: 237 10*3/uL (ref 150–450)
RBC: 4.66 x10E6/uL (ref 3.77–5.28)
RDW: 11.7 % (ref 11.7–15.4)
WBC: 5.1 10*3/uL (ref 3.4–10.8)

## 2023-05-15 LAB — TSH: TSH: 0.984 u[IU]/mL (ref 0.450–4.500)

## 2023-05-20 LAB — QUANTIFERON-TB GOLD PLUS
QuantiFERON Nil Value: 0.06 [IU]/mL
QuantiFERON TB1 Ag Value: 0.13 [IU]/mL
QuantiFERON TB2 Ag Value: 0.15 [IU]/mL

## 2023-05-21 ENCOUNTER — Encounter: Payer: Self-pay | Admitting: Family Medicine

## 2023-05-25 ENCOUNTER — Encounter: Payer: 59 | Admitting: Family Medicine

## 2023-05-26 ENCOUNTER — Other Ambulatory Visit: Payer: Self-pay | Admitting: Family Medicine

## 2023-05-28 NOTE — Telephone Encounter (Signed)
 Unable to refill per protocol, Rx expired. Discontinued 05/14/23, dose change.  Requested Prescriptions  Pending Prescriptions Disp Refills   buPROPion (WELLBUTRIN SR) 200 MG 12 hr tablet [Pharmacy Med Name: BUPROPION SR 200MG  TABLETS (12HR)] 180 tablet 1    Sig: TAKE 1 TABLET(200 MG) BY MOUTH TWICE DAILY     Psychiatry: Antidepressants - bupropion Failed - 05/28/2023  9:39 AM      Failed - Valid encounter within last 6 months    Recent Outpatient Visits           2 weeks ago Chronic fatigue   [facility] [place], [place], DO       Future Appointments             In 4 weeks Laural Benes, Oralia Rud, DO [facility]            Passed - Cr in normal range and within 360 days    Creatinine, Ser  Date Value Ref Range Status  05/14/2023 0.92 0.57 - 1.00 mg/dL Final         Passed - AST in normal range and within 360 days    AST  Date Value Ref Range Status  05/14/2023 15 0 - 40 IU/L Final         Passed - ALT in normal range and within 360 days    ALT  Date Value Ref Range Status  05/14/2023 10 0 - 32 IU/L Final         Passed - Last BP in normal range    BP Readings from Last 1 Encounters:  05/14/23 108/78

## 2023-06-25 ENCOUNTER — Encounter: Payer: Self-pay | Admitting: Family Medicine

## 2023-06-25 ENCOUNTER — Ambulatory Visit (INDEPENDENT_AMBULATORY_CARE_PROVIDER_SITE_OTHER): Admitting: Family Medicine

## 2023-06-25 VITALS — BP 119/85 | HR 66 | Ht 66.0 in | Wt 173.4 lb

## 2023-06-25 DIAGNOSIS — Z1283 Encounter for screening for malignant neoplasm of skin: Secondary | ICD-10-CM | POA: Diagnosis not present

## 2023-06-25 DIAGNOSIS — Z0000 Encounter for general adult medical examination without abnormal findings: Secondary | ICD-10-CM | POA: Diagnosis not present

## 2023-06-25 MED ORDER — BUPROPION HCL ER (XL) 300 MG PO TB24: 300.0000 mg | ORAL_TABLET | Freq: Every day | ORAL | 1 refills | Status: DC

## 2023-06-25 NOTE — Progress Notes (Signed)
 BP 119/85 (BP Location: Left Arm, Patient Position: Sitting)   Pulse 66   Ht 5\' 6"  (1.676 m)   Wt 173 lb 6.4 oz (78.7 kg)   BMI 27.99 kg/m    Subjective:    Patient ID: [name], female    DOB: 1991/01/22, 33 y.o.   MRN: [id number]  HPI: [name] is a 33 y.o. female presenting on 06/25/2023 for comprehensive medical examination. Current medical complaints include:fatigue is going great. Feeling well. Has gained a couple of pounds. No other concerns.   She currently lives with: husband and kids Menopausal Symptoms: no  Depression Screen done today and results listed below:     06/25/2023    1:24 PM 11/20/2022    8:41 AM 05/20/2022    3:54 PM 01/29/2022   11:16 AM 07/24/2021    1:37 PM  Depression screen PHQ 2/9  Decreased Interest 1 0 0 0 0  Down, Depressed, Hopeless 1 0 0 0 0  PHQ - 2 Score 2 0 0 0 0  Altered sleeping 0 0 0 1 0  Tired, decreased energy 0 0 0 0 0  Change in appetite 0 0 0 0 0  Feeling bad or failure about yourself  0 0 0 0 0  Trouble concentrating 0 0 0 0 0  Moving slowly or fidgety/restless 0 0 0 0 0  Suicidal thoughts 0 0 0 0 0  PHQ-9 Score 2 0 0 1 0  Difficult doing work/chores Not difficult at all Not difficult at all Not difficult at all Not difficult at all     Past Medical History:  Past Medical History:  Diagnosis Date   Asthma    in high school    Surgical History:  Past Surgical History:  Procedure Laterality Date   LAPAROSCOPY FOR ECTOPIC PREGNANCY  08/09/2010    Medications:  Current Outpatient Medications on File Prior to Visit  Medication Sig   Multiple Vitamin (MULTIVITAMIN) tablet Take 1 tablet by mouth daily.   No current facility-administered medications on file prior to visit.    Allergies:  No Known Allergies  Social History:  Social History   Socioeconomic History   Marital status: Married    Spouse name: Not on file   Number of children: Not on file   Years of education: Not on file   Highest  education level: 12th grade  Occupational History   Not on file  Tobacco Use   Smoking status: Never   Smokeless tobacco: Never  Vaping Use   Vaping status: Never Used  Substance and Sexual Activity   Alcohol use: Yes    Comment: 2 a week   Drug use: No   Sexual activity: Yes    Partners: Male    Birth control/protection: None  Other Topics Concern   Not on file  Social History Narrative   Not on file   Social Drivers of Health   Financial Resource Strain: Low Risk  (05/10/2023)   Overall Financial Resource Strain (CARDIA)    Difficulty of Paying Living Expenses: Not very hard  Food Insecurity: No Food Insecurity (05/10/2023)   Hunger Vital Sign    Worried About Running Out of Food in the Last Year: Never true    Ran Out of Food in the Last Year: Never true  Transportation Needs: No Transportation Needs (05/10/2023)   PRAPARE - Administrator, Civil Service (Medical): No    Lack of Transportation (Non-Medical): No  Physical  Activity: Sufficiently Active (05/10/2023)   Exercise Vital Sign    Days of Exercise per Week: 4 days    Minutes of Exercise per Session: 50 min  Stress: No Stress Concern Present (05/10/2023)   [employer] of Occupational Health - Occupational Stress Questionnaire    Feeling of Stress : Not at all  Social Connections: Moderately Integrated (05/10/2023)   Social Connection and Isolation Panel [NHANES]    Frequency of Communication with Friends and Family: Once a week    Frequency of Social Gatherings with Friends and Family: Once a week    Attends Religious Services: More than 4 times per year    Active Member of [employer] or Organizations: Yes    Attends Engineer, structural: More than 4 times per year    Marital Status: Married  Catering manager Violence: Not on file   Social History   Tobacco Use  Smoking Status Never  Smokeless Tobacco Never   Social History   Substance and Sexual Activity  Alcohol Use Yes   Comment:  2 a week    Family History:  Family History  Problem Relation Age of Onset   Huntington's disease Mother    Depression Mother    Hypertension Father    Huntington's disease Sister    Huntington's disease Maternal Grandfather    Mental illness Paternal Grandmother    Hyperlipidemia Paternal Grandfather    Coronary artery disease Paternal Grandfather    Sudden Cardiac Death Neg Hx     Past medical history, surgical history, medications, allergies, family history and social history reviewed with patient today and changes made to appropriate areas of the chart.   Review of Systems  HENT: Negative.    Eyes: Negative.   Respiratory: Negative.    Cardiovascular: Negative.   Gastrointestinal: Negative.   Genitourinary: Negative.   Musculoskeletal: Negative.   Skin: Negative.   Neurological: Negative.   Endo/Heme/Allergies: Negative.   Psychiatric/Behavioral: Negative.     All other ROS negative except what is listed above and in the HPI.      Objective:    BP 119/85 (BP Location: Left Arm, Patient Position: Sitting)   Pulse 66   Ht 5\' 6"  (1.676 m)   Wt 173 lb 6.4 oz (78.7 kg)   BMI 27.99 kg/m   Wt Readings from Last 3 Encounters:  06/25/23 173 lb 6.4 oz (78.7 kg)  05/14/23 171 lb (77.6 kg)  11/20/22 172 lb 3.2 oz (78.1 kg)    Physical Exam Vitals and nursing note reviewed.  Constitutional:      General: She is not in acute distress.    Appearance: Normal appearance. She is normal weight. She is not ill-appearing, toxic-appearing or diaphoretic.  HENT:     Head: Normocephalic and atraumatic.     Right Ear: Tympanic membrane, ear canal and external ear normal. There is no impacted cerumen.     Left Ear: Tympanic membrane, ear canal and external ear normal. There is no impacted cerumen.     Nose: Nose normal. No congestion or rhinorrhea.     Mouth/Throat:     Mouth: Mucous membranes are moist.     Pharynx: Oropharynx is clear. No oropharyngeal exudate or posterior  oropharyngeal erythema.  Eyes:     General: No scleral icterus.       Right eye: No discharge.        Left eye: No discharge.     Extraocular Movements: Extraocular movements intact.     Conjunctiva/sclera: Conjunctivae  normal.     Pupils: Pupils are equal, round, and reactive to light.  Neck:     Vascular: No carotid bruit.  Cardiovascular:     Rate and Rhythm: Normal rate and regular rhythm.     Pulses: Normal pulses.     Heart sounds: No murmur heard.    No friction rub. No gallop.  Pulmonary:     Effort: Pulmonary effort is normal. No respiratory distress.     Breath sounds: Normal breath sounds. No stridor. No wheezing, rhonchi or rales.  Chest:     Chest wall: No tenderness.  Abdominal:     General: Abdomen is flat. Bowel sounds are normal. There is no distension.     Palpations: Abdomen is soft. There is no mass.     Tenderness: There is no abdominal tenderness. There is no right CVA tenderness, left CVA tenderness, guarding or rebound.     Hernia: No hernia is present.  Genitourinary:    Comments: Breast and pelvic exams deferred with shared decision making Musculoskeletal:        General: No swelling, tenderness, deformity or signs of injury.     Cervical back: Normal range of motion and neck supple. No rigidity. No muscular tenderness.     Right lower leg: No edema.     Left lower leg: No edema.  Lymphadenopathy:     Cervical: No cervical adenopathy.  Skin:    General: Skin is warm and dry.     Capillary Refill: Capillary refill takes less than 2 seconds.     Coloration: Skin is not jaundiced or pale.     Findings: No bruising, erythema, lesion or rash.  Neurological:     General: No focal deficit present.     Mental Status: She is alert and oriented to person, place, and time. Mental status is at baseline.     Cranial Nerves: No cranial nerve deficit.     Sensory: No sensory deficit.     Motor: No weakness.     Coordination: Coordination normal.     Gait: Gait  normal.     Deep Tendon Reflexes: Reflexes normal.  Psychiatric:        Mood and Affect: Mood normal.        Behavior: Behavior normal.        Thought Content: Thought content normal.        Judgment: Judgment normal.     Results for orders placed or performed in visit on 05/14/23  Microscopic Examination   Collection Time: 05/14/23  1:26 PM   Urine  Result Value Ref Range   RBC, Urine 0-2 0 - 2 /hpf   Epithelial Cells (non renal) 0-10 0 - 10 /hpf  Urinalysis, Routine w reflex microscopic   Collection Time: 05/14/23  1:26 PM  Result Value Ref Range   Specific Gravity, UA 1.010 1.005 - 1.030   pH, UA 7.0 5.0 - 7.5   Color, UA Yellow Yellow   Appearance Ur Clear Clear   Leukocytes,UA Negative Negative   Protein,UA Negative Negative/Trace   Glucose, UA Negative Negative   Ketones, UA Negative Negative   RBC, UA Trace (A) Negative   Bilirubin, UA Negative Negative   Urobilinogen, Ur 0.2 0.2 - 1.0 mg/dL   Nitrite, UA Negative Negative   Microscopic Examination See below:   CBC with Differential/Platelet   Collection Time: 05/14/23  1:27 PM  Result Value Ref Range   WBC 5.1 3.4 - 10.8 x10E3/uL   RBC  4.66 3.77 - 5.28 x10E6/uL   Hemoglobin 14.1 11.1 - 15.9 g/dL   Hematocrit 52.8 41.3 - 46.6 %   MCV 90 79 - 97 fL   MCH 30.3 26.6 - 33.0 pg   MCHC 33.7 31.5 - 35.7 g/dL   RDW 24.4 01.0 - 27.2 %   Platelets 237 150 - 450 x10E3/uL   Neutrophils 53 Not Estab. %   Lymphs 36 Not Estab. %   Monocytes 8 Not Estab. %   Eos 2 Not Estab. %   Basos 1 Not Estab. %   Neutrophils Absolute 2.7 1.4 - 7.0 x10E3/uL   Lymphocytes Absolute 1.8 0.7 - 3.1 x10E3/uL   Monocytes Absolute 0.4 0.1 - 0.9 x10E3/uL   EOS (ABSOLUTE) 0.1 0.0 - 0.4 x10E3/uL   Basophils Absolute 0.0 0.0 - 0.2 x10E3/uL   Immature Granulocytes 0 Not Estab. %   Immature Grans (Abs) 0.0 0.0 - 0.1 x10E3/uL  Lipid Panel w/o Chol/HDL Ratio   Collection Time: 05/14/23  1:27 PM  Result Value Ref Range   Cholesterol, Total 154  100 - 199 mg/dL   Triglycerides 48 0 - 149 mg/dL   HDL 65 >53 mg/dL   VLDL Cholesterol Cal 10 5 - 40 mg/dL   LDL Chol Calc (NIH) 79 0 - 99 mg/dL  TSH   Collection Time: 05/14/23  1:27 PM  Result Value Ref Range   TSH 0.984 0.450 - 4.500 uIU/mL  Comprehensive metabolic panel   Collection Time: 05/14/23  1:27 PM  Result Value Ref Range   Glucose 93 70 - 99 mg/dL   BUN 12 6 - 20 mg/dL   Creatinine, Ser 6.64 0.57 - 1.00 mg/dL   eGFR 84 >40 HK/VQQ/5.95   BUN/Creatinine Ratio 13 9 - 23   Sodium 142 134 - 144 mmol/L   Potassium 3.8 3.5 - 5.2 mmol/L   Chloride 102 96 - 106 mmol/L   CO2 26 20 - 29 mmol/L   Calcium 9.2 8.7 - 10.2 mg/dL   Total Protein 6.9 6.0 - 8.5 g/dL   Albumin 4.7 3.9 - 4.9 g/dL   Globulin, Total 2.2 1.5 - 4.5 g/dL   Bilirubin Total 0.4 0.0 - 1.2 mg/dL   Alkaline Phosphatase 41 (L) 44 - 121 IU/L   AST 15 0 - 40 IU/L   ALT 10 0 - 32 IU/L  QuantiFERON-TB Gold Plus   Collection Time: 05/14/23  2:43 PM  Result Value Ref Range   QuantiFERON Incubation Incubation performed.    QuantiFERON Criteria Comment    QuantiFERON TB1 Ag Value 0.13 IU/mL   QuantiFERON TB2 Ag Value 0.15 IU/mL   QuantiFERON Nil Value 0.06 IU/mL   QuantiFERON Mitogen Value >10.00 IU/mL   QuantiFERON-TB Gold Plus Negative Negative      Assessment & Plan:   Problem List Items Addressed This Visit   None Visit Diagnoses       Routine general medical examination at a health care facility    -  Primary   Vaccines up to date except Tdap- will get ASAP as she's adopting. Pap up to date. Continue diet and exercise. Call with any concerns. Labs last visit normal.     Screening for skin cancer       Referral to dermatology placed today.   Relevant Orders   Ambulatory referral to Dermatology        Follow up plan: Return in about 6 months (around 12/26/2023).   LABORATORY TESTING:  - Pap smear: up to date  IMMUNIZATIONS:   - Tdap: Tetanus vaccination status reviewed: ordered- out of stock,  will get next week. - Influenza: Postponed to flu season - Pneumovax: Not applicable - Prevnar: Not applicable - COVID: Refused - HPV: Not applicable  PATIENT COUNSELING:   Advised to take 1 mg of folate supplement per day if capable of pregnancy.   Sexuality: Discussed sexually transmitted diseases, partner selection, use of condoms, avoidance of unintended pregnancy  and contraceptive alternatives.   Advised to avoid cigarette smoking.  I discussed with the patient that most people either abstain from alcohol or drink within safe limits (<=14/week and <=4 drinks/occasion for males, <=7/weeks and <= 3 drinks/occasion for females) and that the risk for alcohol disorders and other health effects rises proportionally with the number of drinks per week and how often a drinker exceeds daily limits.  Discussed cessation/primary prevention of drug use and availability of treatment for abuse.   Diet: Encouraged to adjust caloric intake to maintain  or achieve ideal body weight, to reduce intake of dietary saturated fat and total fat, to limit sodium intake by avoiding high sodium foods and not adding table salt, and to maintain adequate dietary potassium and calcium preferably from fresh fruits, vegetables, and low-fat dairy products.    stressed the importance of regular exercise  Injury prevention: Discussed safety belts, safety helmets, smoke detector, smoking near bedding or upholstery.   Dental health: Discussed importance of regular tooth brushing, flossing, and dental visits.    NEXT PREVENTATIVE PHYSICAL DUE IN 1 YEAR. Return in about 6 months (around 12/26/2023).

## 2023-06-30 ENCOUNTER — Ambulatory Visit (INDEPENDENT_AMBULATORY_CARE_PROVIDER_SITE_OTHER)

## 2023-06-30 DIAGNOSIS — Z789 Other specified health status: Secondary | ICD-10-CM

## 2023-06-30 DIAGNOSIS — Z23 Encounter for immunization: Secondary | ICD-10-CM

## 2023-07-01 ENCOUNTER — Encounter: Payer: Self-pay | Admitting: Nurse Practitioner

## 2023-07-01 LAB — MEASLES/MUMPS/RUBELLA IMMUNITY
MUMPS ABS, IGG: 24.8 [AU]/ml (ref >10.9)
RUBEOLA AB, IGG: 39.9 [AU]/ml (ref >16.4)
Rubella Antibodies, IGG: 6.01 {index} (ref >0.99)

## 2023-07-01 NOTE — Progress Notes (Signed)
 Patient is in office today for a nurse visit for Immunization. Patient Injection was given in the  Left deltoid. Patient tolerated injection well.  Patient also inquired about receiving a titer for MMR considering possible adoption of child from [place] . Lab ordered by provider and patient completed.

## 2023-07-30 ENCOUNTER — Encounter: Payer: Self-pay | Admitting: Family Medicine

## 2023-08-09 MED ORDER — BUPROPION HCL ER (XL) 150 MG PO TB24: 150.0000 mg | ORAL_TABLET | Freq: Every day | ORAL | 0 refills | Status: DC

## 2023-08-25 ENCOUNTER — Encounter: Payer: Self-pay | Admitting: Family Medicine

## 2023-08-25 MED ORDER — BUPROPION HCL ER (XL) 300 MG PO TB24: 300.0000 mg | ORAL_TABLET | Freq: Every day | ORAL | 0 refills | Status: AC

## 2023-11-15 ENCOUNTER — Other Ambulatory Visit: Payer: Self-pay | Admitting: Family Medicine

## 2023-11-16 NOTE — Telephone Encounter (Signed)
 Requested Prescriptions  Refused Prescriptions Disp Refills   buPROPion  (WELLBUTRIN  XL) 150 MG 24 hr tablet [Pharmacy Med Name: BUPROPION  XL 150MG  TABLETS (24 H)] 90 tablet 0    Sig: TAKE 1 TABLET(150 MG) BY MOUTH DAILY     Psychiatry: Antidepressants - bupropion  Passed - 11/16/2023  4:28 PM      Passed - Cr in normal range and within 360 days    Creatinine, Ser  Date Value Ref Range Status  05/14/2023 0.92 0.57 - 1.00 mg/dL Final         Passed - AST in normal range and within 360 days    AST  Date Value Ref Range Status  05/14/2023 15 0 - 40 IU/L Final         Passed - ALT in normal range and within 360 days    ALT  Date Value Ref Range Status  05/14/2023 10 0 - 32 IU/L Final         Passed - Last BP in normal range    BP Readings from Last 1 Encounters:  06/25/23 119/85         Passed - Valid encounter within last 6 months    Recent Outpatient Visits           4 months ago Routine general medical examination at a health care facility   [facility] [place], [place] P, DO   6 months ago Chronic fatigue   [place] [facility] [place], [place], DO

## 2023-12-29 IMAGING — US US PELVIS COMPLETE WITH TRANSVAGINAL
1 series · 15 of 25 positions shown · non-contrast
Comparison: None

CLINICAL DATA: Abnormal bleeding.

EXAM:
TRANSABDOMINAL AND TRANSVAGINAL ULTRASOUND OF PELVIS
TECHNIQUE: Both transabdominal and transvaginal ultrasound examinations of the
pelvis were performed. Transabdominal technique was performed for
global imaging of the pelvis including uterus, ovaries, adnexal
regions, and pelvic cul-de-sac. It was necessary to proceed with
endovaginal exam following the transabdominal exam to visualize the
endometrium and ovaries.

[Series 1: us pelvis complete · 15 of 57 slices shown]
[im 1/57]
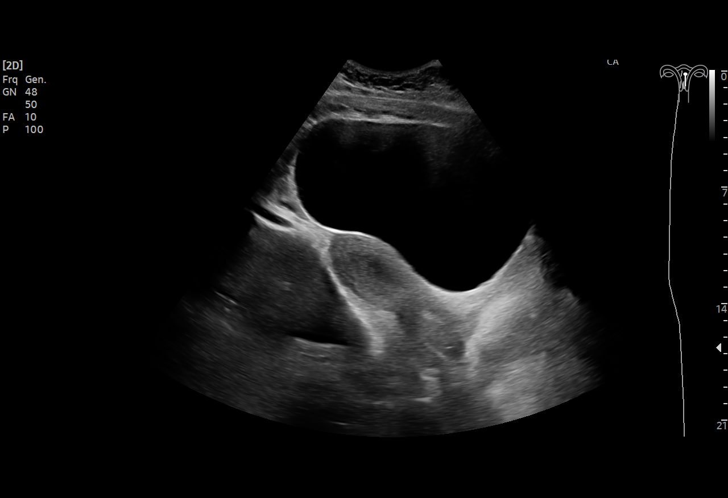
[im 5/57]
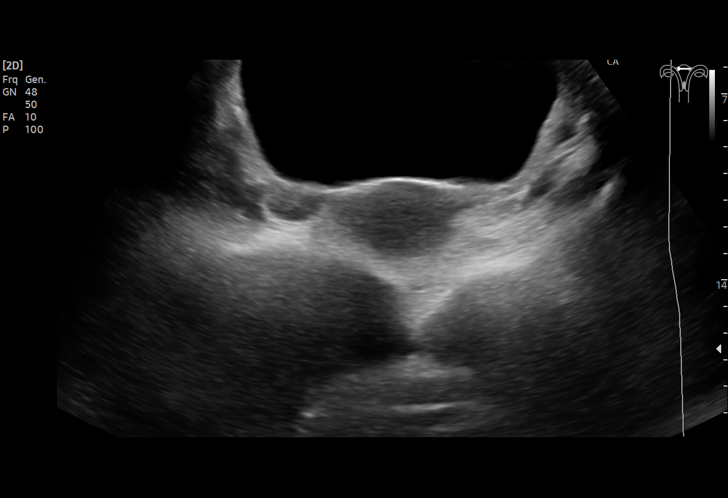
[im 10/57]
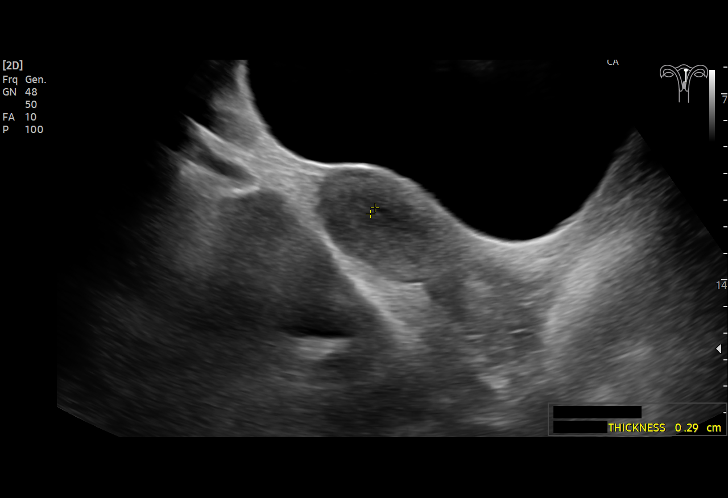
[im 12/57]
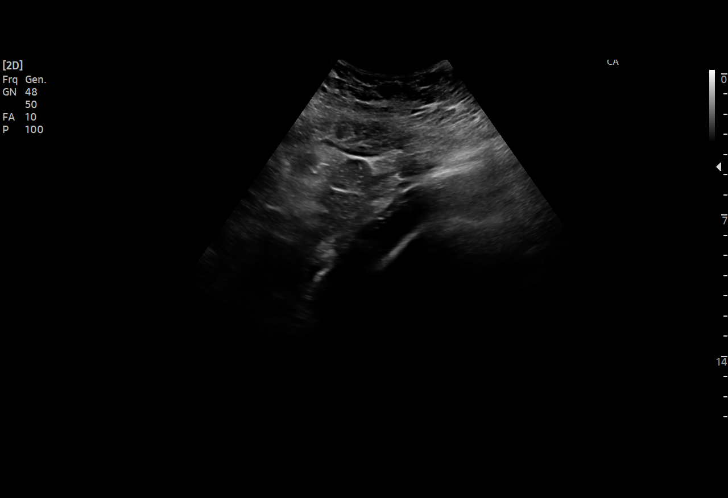
[im 17/57]
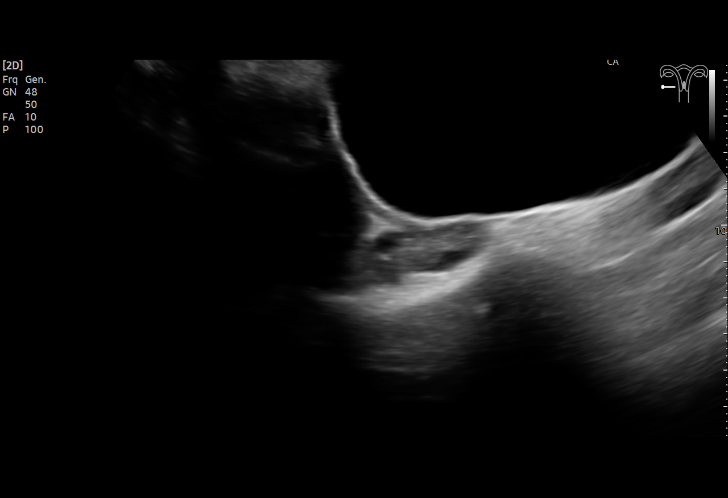
[im 22/57]
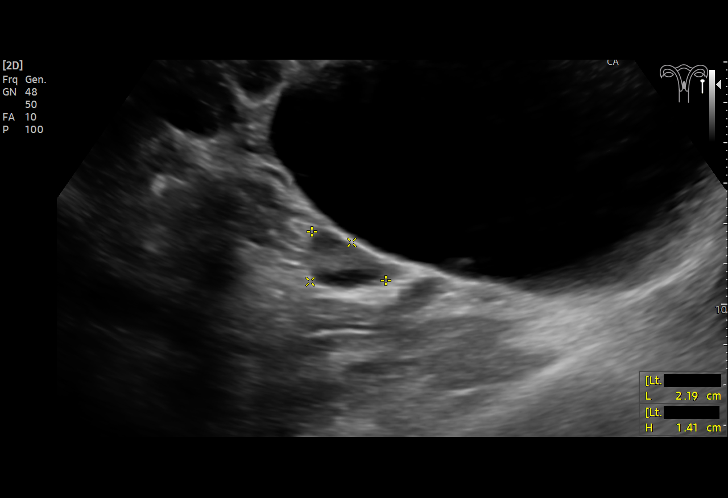
[im 24/57]
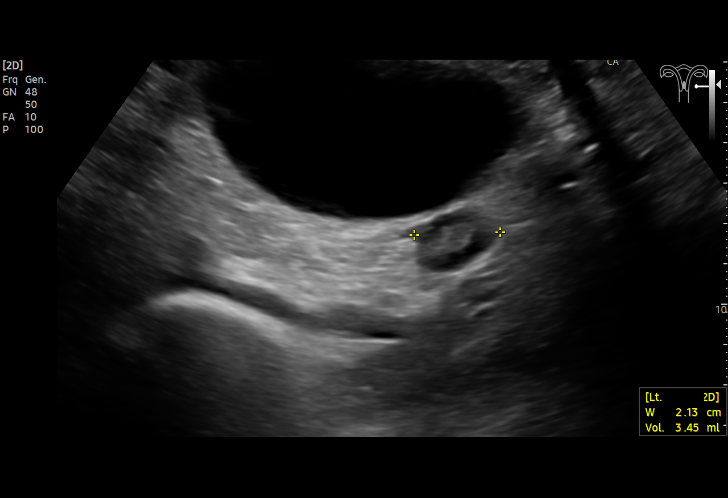
[im 29/57]
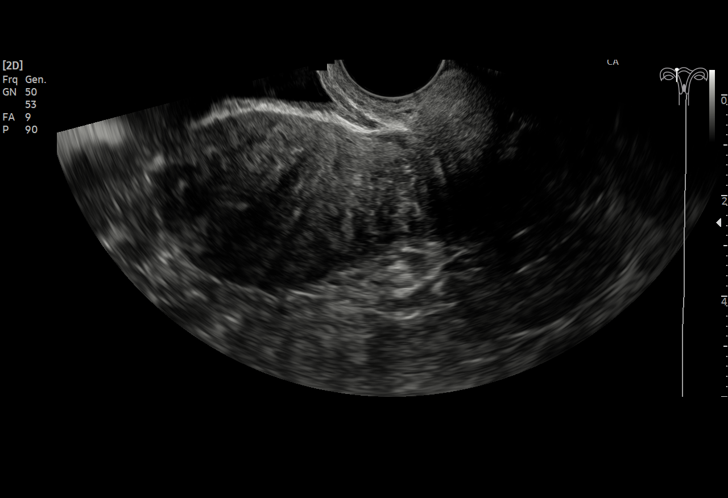
[im 33/57]
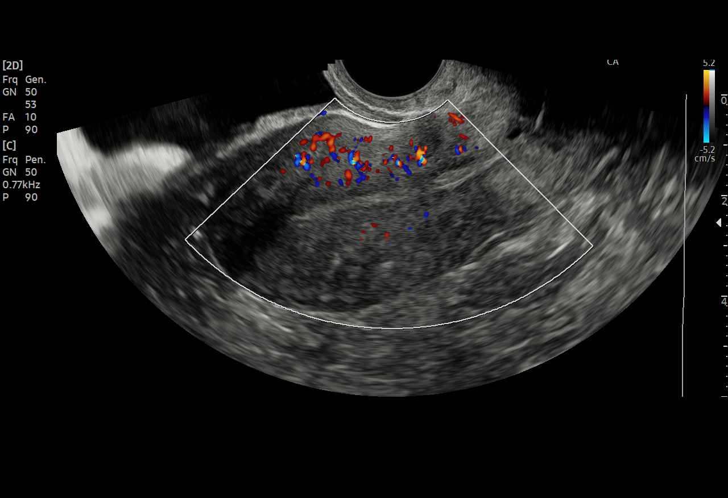
[im 36/57]
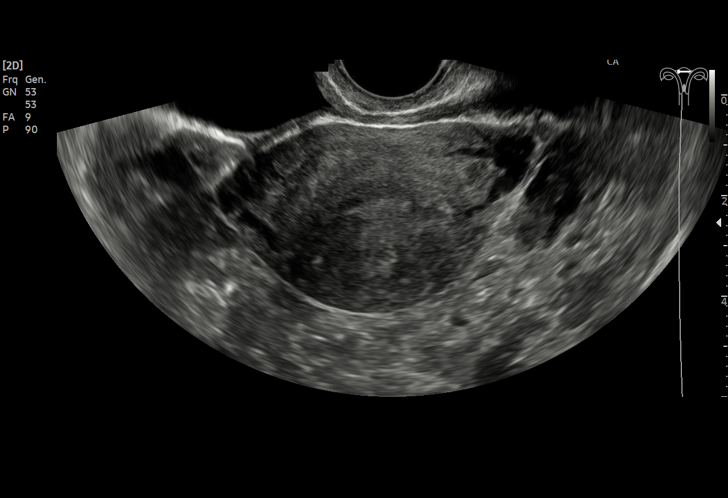
[im 40/57]
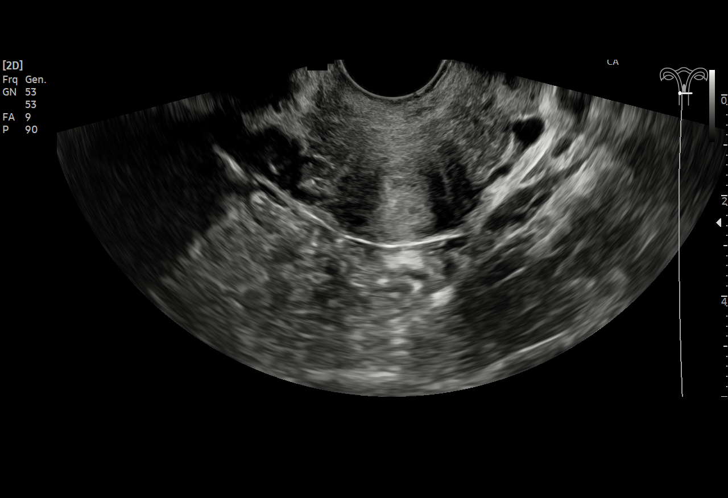
[im 45/57]
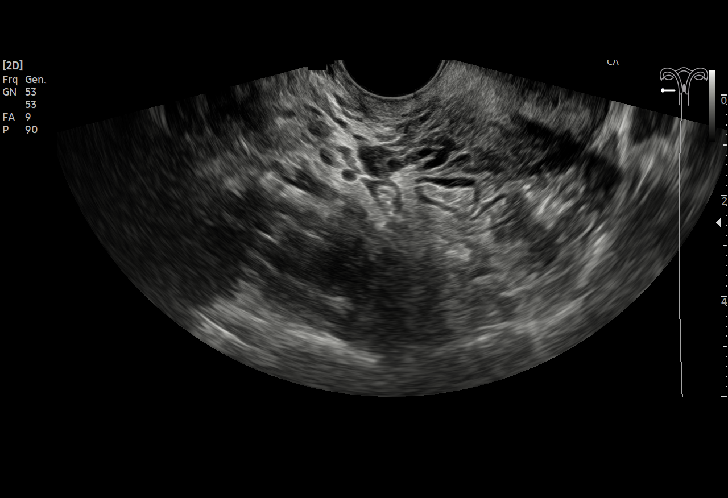
[im 47/57]
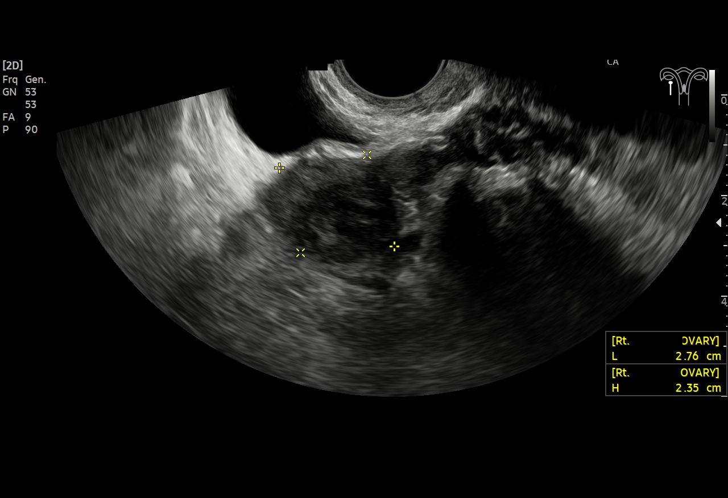
[im 52/57]
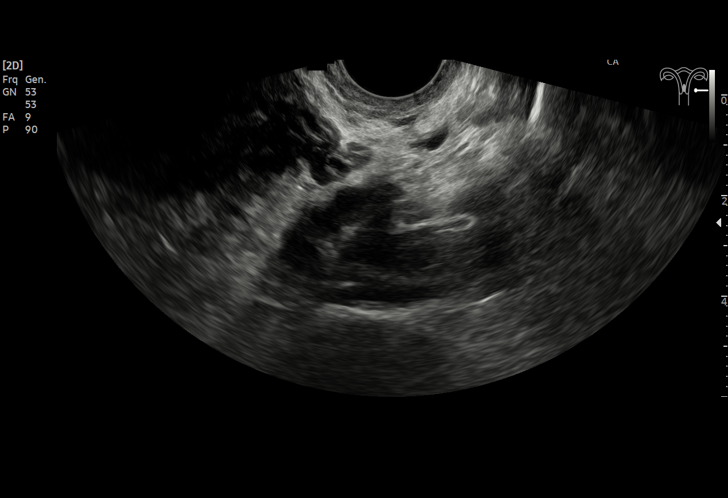
[im 57/57]
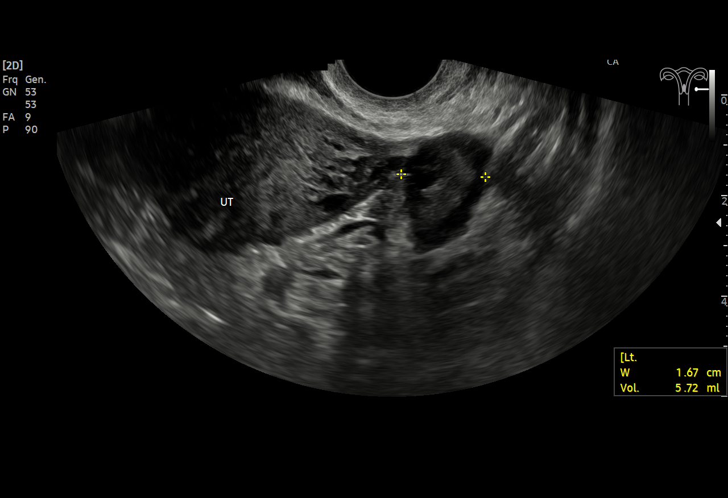

[15 of 25 positions shown; findings below may reference images not displayed]

FINDINGS: Uterus

Measurements: 9.2 x 3.9 x 5.0 cm = volume: 93.2 mL. No fibroids or
other mass visualized.

Endometrium

Thickness: 2.9 mm.  No focal abnormality visualized.

Right ovary

Measurements: 2.8 x 2.4 x 2.2 cm = volume: 7.3 mL. Normal
appearance/no adnexal mass.

Left ovary

Measurements: 3.0 x 2.2 x 1.7 cm = volume: 5.7 mL. Normal
appearance/no adnexal mass.

Other findings

No abnormal free fluid.
IMPRESSION: 1. The uterus, endometrium, and ovaries are unremarkable.
2. Specifically, the endometrium is not thickened measuring 2.9 mm.
If bleeding remains unresponsive to hormonal or medical therapy,
sonohysterogram should be considered for focal lesion work-up. (Ref:
Radiological Reasoning: Algorithmic Workup of Abnormal Vaginal
Bleeding with Endovaginal Sonography and Sonohysterography. AJR
[id number]; [id number])

## 2024-06-28 ENCOUNTER — Encounter: Admitting: Family Medicine
